# Patient Record
Sex: Female | Born: 1965 | Race: White | Hispanic: No | Marital: Married | Smoking: Never smoker
Health system: Southern US, Community
[De-identification: ages and names within clinical notes are randomized; demographics above are authoritative.]

## PROBLEM LIST (undated history)

## (undated) DIAGNOSIS — E079 Disorder of thyroid, unspecified: Secondary | ICD-10-CM

## (undated) DIAGNOSIS — R112 Nausea with vomiting, unspecified: Secondary | ICD-10-CM

## (undated) DIAGNOSIS — A6920 Lyme disease, unspecified: Secondary | ICD-10-CM

## (undated) DIAGNOSIS — T4145XA Adverse effect of unspecified anesthetic, initial encounter: Secondary | ICD-10-CM

## (undated) DIAGNOSIS — Z9889 Other specified postprocedural states: Secondary | ICD-10-CM

## (undated) DIAGNOSIS — T8859XA Other complications of anesthesia, initial encounter: Secondary | ICD-10-CM

## (undated) DIAGNOSIS — B2790 Infectious mononucleosis, unspecified without complication: Secondary | ICD-10-CM

## (undated) HISTORY — PX: APPENDECTOMY: SHX54

## (undated) HISTORY — PX: LIPOSUCTION: SHX10

---

## 1998-02-01 ENCOUNTER — Ambulatory Visit (HOSPITAL_COMMUNITY): Admission: RE | Admit: 1998-02-01 | Discharge: 1998-02-01 | Payer: Self-pay | Admitting: Specialist

## 1998-06-04 ENCOUNTER — Emergency Department (HOSPITAL_COMMUNITY): Admission: EM | Admit: 1998-06-04 | Discharge: 1998-06-04 | Payer: Self-pay | Admitting: Emergency Medicine

## 1998-07-19 ENCOUNTER — Other Ambulatory Visit: Admission: RE | Admit: 1998-07-19 | Discharge: 1998-07-19 | Payer: Self-pay | Admitting: Obstetrics and Gynecology

## 1999-07-16 ENCOUNTER — Other Ambulatory Visit: Admission: RE | Admit: 1999-07-16 | Discharge: 1999-07-16 | Payer: Self-pay | Admitting: Obstetrics and Gynecology

## 2000-07-27 ENCOUNTER — Other Ambulatory Visit: Admission: RE | Admit: 2000-07-27 | Discharge: 2000-07-27 | Payer: Self-pay | Admitting: Obstetrics and Gynecology

## 2000-08-03 ENCOUNTER — Encounter: Payer: Self-pay | Admitting: Obstetrics and Gynecology

## 2000-08-03 ENCOUNTER — Encounter: Admission: RE | Admit: 2000-08-03 | Discharge: 2000-08-03 | Payer: Self-pay | Admitting: Obstetrics and Gynecology

## 2001-02-02 ENCOUNTER — Encounter: Admission: RE | Admit: 2001-02-02 | Discharge: 2001-02-02 | Payer: Self-pay | Admitting: Obstetrics and Gynecology

## 2001-02-02 ENCOUNTER — Encounter: Payer: Self-pay | Admitting: Obstetrics and Gynecology

## 2001-12-29 ENCOUNTER — Other Ambulatory Visit: Admission: RE | Admit: 2001-12-29 | Discharge: 2001-12-29 | Payer: Self-pay | Admitting: Obstetrics and Gynecology

## 2003-06-05 ENCOUNTER — Other Ambulatory Visit: Admission: RE | Admit: 2003-06-05 | Discharge: 2003-06-05 | Payer: Self-pay | Admitting: Obstetrics and Gynecology

## 2004-06-03 ENCOUNTER — Other Ambulatory Visit: Admission: RE | Admit: 2004-06-03 | Discharge: 2004-06-03 | Payer: Self-pay | Admitting: Obstetrics and Gynecology

## 2004-10-22 ENCOUNTER — Ambulatory Visit (HOSPITAL_COMMUNITY): Admission: RE | Admit: 2004-10-22 | Discharge: 2004-10-22 | Payer: Self-pay | Admitting: Obstetrics and Gynecology

## 2004-12-20 ENCOUNTER — Inpatient Hospital Stay (HOSPITAL_COMMUNITY): Admission: RE | Admit: 2004-12-20 | Discharge: 2004-12-23 | Payer: Self-pay | Admitting: Obstetrics and Gynecology

## 2005-02-05 ENCOUNTER — Other Ambulatory Visit: Admission: RE | Admit: 2005-02-05 | Discharge: 2005-02-05 | Payer: Self-pay | Admitting: Obstetrics and Gynecology

## 2007-01-27 ENCOUNTER — Encounter: Admission: RE | Admit: 2007-01-27 | Discharge: 2007-01-27 | Payer: Self-pay | Admitting: Internal Medicine

## 2007-02-08 ENCOUNTER — Encounter: Admission: RE | Admit: 2007-02-08 | Discharge: 2007-02-08 | Payer: Self-pay | Admitting: Internal Medicine

## 2007-03-01 ENCOUNTER — Encounter: Admission: RE | Admit: 2007-03-01 | Discharge: 2007-03-01 | Payer: Self-pay | Admitting: Internal Medicine

## 2007-03-10 ENCOUNTER — Encounter: Admission: RE | Admit: 2007-03-10 | Discharge: 2007-05-05 | Payer: Self-pay | Admitting: Internal Medicine

## 2009-06-11 ENCOUNTER — Ambulatory Visit: Payer: Self-pay | Admitting: Internal Medicine

## 2009-11-12 ENCOUNTER — Ambulatory Visit: Payer: Self-pay | Admitting: Internal Medicine

## 2011-01-17 NOTE — H&P (Signed)
Nancy Castaneda, GILLETTE              ACCOUNT NO.:  [id number]   MEDICAL RECORD NO.:  [id number]          PATIENT TYPE:  INP   LOCATION:  NA                            FACILITY:  [facility]   PHYSICIAN:  Duke Salvia. Marcelle Overlie, M.D.DATE OF BIRTH:  1965/11/14   DATE OF ADMISSION:  DATE OF DISCHARGE:                                HISTORY & PHYSICAL   CHIEF COMPLAINT:  Repeat cesarean section at term.   HISTORY OF PRESENT ILLNESS:  A 45 year old, G2, P8, EDD December 28, 2004, who  presents for RCS after uncomplicated prenatal course.  She had early trisomy  21 screening at [facility] that was normal.  Blood type was O positive.  Rubella  titer was immune.  Her one hour GTT was 93.  GBS is negative.   PAST MEDICAL HISTORY:   ALLERGIES:  CODEINE.   OPERATIONS:  Cesarean section in 1998 for CPD after pushing for 4 hours.   REVIEW OF SYSTEMS:  Otherwise unremarkable.   FAMILY HISTORY:  Please see the enclosed hollister form.   PHYSICAL EXAMINATION:  VITAL SIGNS:  Temperature 98.2, blood pressure  120/62.  HEENT:  Unremarkable.  NECK:  Supple without masses.  LUNGS:  Clear.  CARDIOVASCULAR:  Regular rate and rhythm without murmurs, rubs, or gallops  noted.  BREASTS:  Not examined.  ABDOMEN:  Term fundal height.  Fetal heart rate 140.  PELVIC:  Cervix was closed.  EXTREMITIES:  Neurologic exam unremarkable.   IMPRESSION:  Term intrauterine pregnancy.  Previous cesarean section.  Declines vaginal birth after cesarean section.   PLAN:  Repeat cesarean section.  This procedure, including risks of  bleeding, transfusion, adjacent organ injury, expected recovery time, along  with other risks related to phlebitis and wound infection were discussed,  all of which he understands and accepts.    RMH/MEDQ  D:  12/19/2004  T:  12/19/2004  Job:  [id number]

## 2011-01-17 NOTE — Op Note (Signed)
Nancy Castaneda, KEEL              ACCOUNT NO.:  [id number]   MEDICAL RECORD NO.:  [id number]          PATIENT TYPE:  INP   LOCATION:  9199                          FACILITY:  [facility]   PHYSICIAN:  Duke Salvia. Marcelle Overlie, M.D.DATE OF BIRTH:  [birth year]   DATE OF PROCEDURE:  12/20/2004  DATE OF DISCHARGE:                                 OPERATIVE REPORT   PREOPERATIVE DIAGNOSES:  1.  Term intrauterine pregnancy.  2.  Prior cesarean section, declines vaginal birth after cesarean delivery.   POSTOPERATIVE DIAGNOSES:  1.  Term intrauterine pregnancy.  2.  Prior cesarean section, declines vaginal birth after cesarean delivery.   PROCEDURE:  Repeat low transverse cesarean section.   SURGEON:  Duke Salvia. Marcelle Overlie, M.D.   ANESTHESIA:  Spinal.   COMPLICATIONS:  None.   DRAINS:  Foley catheter.   ESTIMATED BLOOD LOSS:  800.   PROCEDURE AND FINDINGS:  The patient sent to the operating room.  After an  adequate level of spinal anesthetic was obtained with the patient supine,  the abdomen prepped and draped in the usual manner for sterile abdominal  procedures.  A Foley catheter positioned draining clear urine.  Transverse  incision made through the old scar, which was well-healed, carried down to  the fascia, which was incised and extended transversely.  The rectus muscles  were divided in the midline, the peritoneum entered superiorly without  incident and extended in a vertical fashion.  The vesicouterine serosa was  then incised and the bladder was bluntly and sharply dissected off of the  lower uterine segment and the bladder blade was positioned.  A transverse  incision was then made, extended with bandage scissors, clear fluid noted.  The patient then easily delivered of a clear female, Apgars 9 and 9.  The  infant was suctioned, the cord clamped, and passed to the pediatric team for  further care.  The placenta was removed manually intact, uterus  exteriorized, cavity wiped clean with  a laparotomy pack.  The uterine  incision closed with a first layer of 0 chromic in a locked fashion,  followed by an imbricating layer of 0 chromic.  This was hemostatic.  Tubes  and ovaries were inspected and noted to be normal.  The bladder flap area  was intact and hemostatic.  Prior to closure, sponge, needle and instrument  counts were reported as correct x2.  Peritoneum closed with a running 3-0  Vicryl suture.  Vicryl 3-0 interrupted sutures used to reapproximate the  rectus muscles in the midline, a 0 PDS suture used from laterally to midline  on either side to close the fascia.  The subcutaneous tissue was undermined  to  release the tension.  This was made hemostatic with the Bovie.  This was  then closed with staples and Steri-Strips along with a pressure dressing.  She received Ancef 1 g IV after the cord was clamped along with Pitocin IV.  Clear urine noted at the end of the case.  Mother and baby doing well at  that point.      RMH/MEDQ  D:  12/20/2004  T:  12/20/2004  Job:  [id number]

## 2011-01-17 NOTE — Discharge Summary (Signed)
NAMEINOLA, Nancy Castaneda              ACCOUNT NO.:  [id number]   MEDICAL RECORD NO.:  [id number]          PATIENT TYPE:  INP   LOCATION:  9141                          [facility]ACILITY:  [facility]   PHYSICIAN:  Duke Salvia. Marcelle Overlie, M.D.DATE OF BIRTH:  05-Dec-1965   DATE OF ADMISSION:  12/20/2004  DATE OF DISCHARGE:  12/23/2004                                 DISCHARGE SUMMARY   ADMITTING DIAGNOSES:  1.  Intrauterine pregnancy at term.  2.  Previous cesarean section, declines vaginal birth after cesarean.   DISCHARGE DIAGNOSES:  1.  Status post low transverse cesarean section.  2.  Viable female infant.   PROCEDURE:  Repeat low transverse cesarean section.   REASON FOR ADMISSION:  Please see dictated H&P.   HOSPITAL COURSE:  The patient was a 45 year old gravida 2 para 1 that was  admitted to [facility] for scheduled ces[facility] section. The  patient had had a previous cesarean section, desired repeat. On the morning  of admission, the patient was taken to the operating room where spinal  anesthesia was administered without difficulty. A low transverse incision  was with the delivery of viable female infant weighing 7 pounds 13 ounces  with Apgars of 9 at one minute and 9 at five minutes. The patient tolerated  the procedure well and was taken to the recovery room in stable condition.  On postoperative day #1 the patient was without complaint. Vital signs were  stable. Abdomen soft with good return of bowel function. Fundus was firm and  nontender. Abdominal dressing was noted to be clean, dry and intact.  Laboratory findings revealed hemoglobin of 10.3. On postoperative day #2 the  patient was without complaint. Vital signs remained stable. She was  afebrile. Abdominal dressing had been removed revealing incision that is  clean, dry and intact. The patient was ambulating well and tolerating a  regular diet without complaints of nausea and vomiting. On postoperative day  #3 the  patient complained of some nausea without vomiting. Vital signs were  stable. She was afebrile. Abdomen was soft. Fundus was firm and nontender.  Incision was clean, dry and intact. Staples were removed. Discharge  instructions reviewed and the patient was discharged home.   CONDITION ON DISCHARGE:  Good.   DIET:  Regular as tolerated.   ACTIVITY:  No heavy lifting, no driving x2 weeks, no vaginal entry.   FOLLOW-UP:  The patient is to follow up in the office in 1-2 weeks for  incision check. She is to call for temperature greater than 100 degrees,  persistent nausea and vomiting, heavy vaginal bleeding, and/or redness or  drainage from the incisional site.   DISCHARGE MEDICATIONS:  1.  Darvocet -N 100 #30 one p.o. q.4-6h. p.r.n.  2.  Motrin 600 mg q.6h.  3.  Phenergan 25 mg #20 one p.o. q.6-8h. p.r.n. nausea.  4.  Prenatal vitamins one p.o. daily.  5.  Colace one p.o. daily p.r.n.      CC/MEDQ  D:  01/17/2005  T:  01/17/2005  Job:  [id number]

## 2011-12-17 ENCOUNTER — Encounter (HOSPITAL_COMMUNITY): Payer: Self-pay | Admitting: Emergency Medicine

## 2011-12-17 ENCOUNTER — Emergency Department (INDEPENDENT_AMBULATORY_CARE_PROVIDER_SITE_OTHER): Payer: BC Managed Care – PPO

## 2011-12-17 ENCOUNTER — Emergency Department (HOSPITAL_COMMUNITY)
Admission: EM | Admit: 2011-12-17 | Discharge: 2011-12-17 | Disposition: A | Discharge status: Home / Self Care | Payer: BC Managed Care – PPO | Source: Home / Self Care | Attending: Emergency Medicine | Admitting: Emergency Medicine

## 2011-12-17 DIAGNOSIS — S62009A Unspecified fracture of navicular [scaphoid] bone of unspecified wrist, initial encounter for closed fracture: Secondary | ICD-10-CM

## 2011-12-17 MED ORDER — HYDROCODONE-ACETAMINOPHEN 5-500 MG PO TABS: 1.0000 | ORAL_TABLET | Freq: Four times a day (QID) | ORAL | Status: AC | PRN

## 2011-12-17 NOTE — ED Provider Notes (Addendum)
History     CSN: [id number]  Arrival date & time 12/17/11  0844   First MD Initiated Contact with Patient 12/17/11 [phone]      Chief Complaint  Patient presents with  . Wrist Pain    (Consider location/radiation/quality/duration/timing/severity/associated sxs/prior treatment) HPI Comments: Patient sustained a fall yesterday she was walking with a friend tripped over an irregular sidewalk, landed on a outstretched left hand. Since yesterday has been expressing pain and have noticed some mild soft tissue swelling of dorsum aspect of her left hand.. Discomfort is exacerbated with minimal movement. Patient denies any tingling, numbness, or weakness. No associated soft tissue injuries are associated with the fall  Patient is a 46 y.o. female presenting with wrist pain. The history is provided by the patient.  Wrist Pain This is a new problem. The current episode started yesterday. The problem occurs constantly. The problem has not changed since onset.The symptoms are aggravated by stress and twisting. The symptoms are relieved by rest. She has tried nothing for the symptoms. The treatment provided no relief.    History reviewed. No pertinent past medical history.  History reviewed. No pertinent past surgical history.  History reviewed. No pertinent family history.  History  Substance Use Topics  . Smoking status: Never Smoker   . Smokeless tobacco: Not on file  . Alcohol Use: No    OB History    Grav Para Term Preterm Abortions TAB SAB Ect Mult Living                  Review of Systems  Constitutional: Negative for fever and diaphoresis.  Musculoskeletal: Positive for joint swelling.  Skin: Negative for color change, rash and wound.  Neurological: Negative for weakness and numbness.    Allergies  Codeine and Lidocaine  Home Medications   Current Outpatient Rx  Name Route Sig Dispense Refill  . VITAMIN C 100 MG PO TABS Oral Take 100 mg by mouth daily.    Marland Kitchen BIOTIN 5 MG  PO TABS Oral Take by mouth.    Marland Kitchen CALCIUM CARBONATE-VITAMIN D 500-200 MG-UNIT PO TABS Oral Take 1 tablet by mouth daily.    Marland Kitchen DHEA 10 MG PO CAPS Oral Take by mouth.    . FOLIC ACID 1 MG PO TABS Oral Take 1 mg by mouth daily.    Marland Kitchen GREEN TEA EXTRACT 90 % LIQD Does not apply by Does not apply route.    . INOSITOL 650 MG PO TABS Oral Take by mouth.    Marland Kitchen POLYSACCHARIDE IRON COMPLEX 150 MG PO CAPS Oral Take 150 mg by mouth 2 (two) times daily.    Marland Kitchen MAGNESIUM GLUCONATE 500 MG PO TABS Oral Take 500 mg by mouth 2 (two) times daily.    . METHYLCOBALAMIN 1 MG PO CHEW Oral Chew by mouth.    . MULTIVITAMINS PO CAPS Oral Take 1 capsule by mouth daily.    . TRANQUIL-EZE PO Oral Take by mouth.    Marland Kitchen MARINE LIPID CONCENTRATE 240-360-5 MG-MG-UNIT PO CAPS Oral Take by mouth.    Marland Kitchen PREGNENOLONE POWD Does not apply by Does not apply route.    Marland Kitchen PROGESTERONE 100 MG VA INST Vaginal Place 100 mg vaginally 2 (two) times daily.    . THYROID 30 MG PO TABS Oral Take 30 mg by mouth daily.    Marland Kitchen VITAMIN B-12 500 MCG PO TABS Oral Take 1,000 mcg by mouth daily.    Marland Kitchen VITAMIN D (ERGOCALCIFEROL) 50000 UNITS PO CAPS Oral Take  50,000 Units by mouth.    Marland Kitchen HYDROCODONE-ACETAMINOPHEN 5-500 MG PO TABS Oral Take 1-2 tablets by mouth every 6 (six) hours as needed for pain. 15 tablet 0    BP 124/82  Pulse 68  Temp(Src) 98.5 F (36.9 C) (Oral)  Resp 14  SpO2 100%  LMP 12/07/2011  Physical Exam  Vitals reviewed. Constitutional: She appears well-developed and well-nourished.  HENT:  Head: Normocephalic.  Musculoskeletal: She exhibits tenderness.       Arms: Neurological: She is alert.  Skin: Skin is warm. No rash noted. No erythema.    ED Course  Procedures (including critical care time)  Labs Reviewed - No data to display No results found.   1. Scaphoid fracture of wrist       MDM          Jimmie Molly, MD 12/17/11 [id number]  Jimmie Molly, MD 12/27/11 1455

## 2011-12-17 NOTE — ED Notes (Signed)
Pt was walking in her neighborhood last night and fell on her left wrist. She has put ice on it all night which helped numb it but it was still very painful.

## 2011-12-17 NOTE — Discharge Instructions (Signed)
Follow-up with the hand orthopedic Dr.,As discussed. We discussed the necessary followup and current treatment needed immobilization

## 2011-12-27 ENCOUNTER — Encounter (HOSPITAL_COMMUNITY): Payer: Self-pay | Admitting: Emergency Medicine

## 2018-10-28 ENCOUNTER — Emergency Department (HOSPITAL_COMMUNITY)
Admission: EM | Admit: 2018-10-28 | Discharge: 2018-10-28 | Disposition: A | Discharge status: Home / Self Care | Payer: BLUE CROSS/BLUE SHIELD | Attending: Emergency Medicine | Admitting: Emergency Medicine

## 2018-10-28 ENCOUNTER — Other Ambulatory Visit: Payer: Self-pay

## 2018-10-28 ENCOUNTER — Emergency Department (HOSPITAL_COMMUNITY): Payer: BLUE CROSS/BLUE SHIELD

## 2018-10-28 ENCOUNTER — Encounter (HOSPITAL_COMMUNITY): Payer: Self-pay | Admitting: Emergency Medicine

## 2018-10-28 DIAGNOSIS — R5383 Other fatigue: Secondary | ICD-10-CM | POA: Insufficient documentation

## 2018-10-28 DIAGNOSIS — R197 Diarrhea, unspecified: Secondary | ICD-10-CM | POA: Insufficient documentation

## 2018-10-28 DIAGNOSIS — R748 Abnormal levels of other serum enzymes: Secondary | ICD-10-CM | POA: Insufficient documentation

## 2018-10-28 DIAGNOSIS — Z79899 Other long term (current) drug therapy: Secondary | ICD-10-CM | POA: Diagnosis not present

## 2018-10-28 DIAGNOSIS — E079 Disorder of thyroid, unspecified: Secondary | ICD-10-CM | POA: Insufficient documentation

## 2018-10-28 DIAGNOSIS — R109 Unspecified abdominal pain: Secondary | ICD-10-CM | POA: Diagnosis not present

## 2018-10-28 DIAGNOSIS — R112 Nausea with vomiting, unspecified: Secondary | ICD-10-CM | POA: Insufficient documentation

## 2018-10-28 DIAGNOSIS — R1013 Epigastric pain: Secondary | ICD-10-CM | POA: Diagnosis present

## 2018-10-28 DIAGNOSIS — R1010 Upper abdominal pain, unspecified: Secondary | ICD-10-CM | POA: Insufficient documentation

## 2018-10-28 HISTORY — DX: Infectious mononucleosis, unspecified without complication: B27.90

## 2018-10-28 HISTORY — DX: Disorder of thyroid, unspecified: E07.9

## 2018-10-28 HISTORY — DX: Lyme disease, unspecified: A69.20

## 2018-10-28 LAB — COMPREHENSIVE METABOLIC PANEL
ALK PHOS: 87 U/L (ref 38–126)
ALT: 24 U/L (ref 0–44)
AST: 22 U/L (ref 15–41)
Albumin: 4 g/dL (ref 3.5–5.0)
Anion gap: 9 (ref 5–15)
BUN: 16 mg/dL (ref 6–20)
CHLORIDE: 106 mmol/L (ref 98–111)
CO2: 24 mmol/L (ref 22–32)
CREATININE: 0.63 mg/dL (ref 0.44–1.00)
Calcium: 9.4 mg/dL (ref 8.9–10.3)
GFR calc Af Amer: >60 mL/min (ref >60)
GFR calc non Af Amer: >60 mL/min (ref >60)
Glucose, Bld: 99 mg/dL (ref 70–99)
Potassium: 3.7 mmol/L (ref 3.5–5.1)
Sodium: 139 mmol/L (ref 135–145)
Total Bilirubin: 0.7 mg/dL (ref 0.3–1.2)
Total Protein: 7.4 g/dL (ref 6.5–8.1)

## 2018-10-28 LAB — URINALYSIS, ROUTINE W REFLEX MICROSCOPIC
BILIRUBIN URINE: NEGATIVE
Glucose, UA: NEGATIVE mg/dL
HGB URINE DIPSTICK: NEGATIVE
KETONES UR: 20 mg/dL — AB
Leukocytes,Ua: NEGATIVE
NITRITE: NEGATIVE
PH: 5 (ref 5.0–8.0)
Protein, ur: NEGATIVE mg/dL
SPECIFIC GRAVITY, URINE: 1.017 (ref 1.005–1.030)

## 2018-10-28 LAB — CBC
HCT: 49.1 % — ABNORMAL HIGH (ref 36.0–46.0)
HEMOGLOBIN: 16.1 g/dL — AB (ref 12.0–15.0)
MCH: 30.6 pg (ref 26.0–34.0)
MCHC: 32.8 g/dL (ref 30.0–36.0)
MCV: 93.2 fL (ref 80.0–100.0)
Platelets: 261 10*3/uL (ref 150–400)
RBC: 5.27 MIL/uL — ABNORMAL HIGH (ref 3.87–5.11)
RDW: 11.9 % (ref 11.5–15.5)
WBC: 15.5 10*3/uL — AB (ref 4.0–10.5)
nRBC: 0 % (ref 0.0–0.2)

## 2018-10-28 LAB — LIPASE, BLOOD: LIPASE: 713 U/L — AB (ref 11–51)

## 2018-10-28 LAB — I-STAT BETA HCG BLOOD, ED (MC, WL, AP ONLY)

## 2018-10-28 MED ORDER — ONDANSETRON HCL 4 MG PO TABS: 4.0000 mg | ORAL_TABLET | Freq: Three times a day (TID) | ORAL | 0 refills | Status: DC | PRN

## 2018-10-28 MED ORDER — IOPAMIDOL (ISOVUE-300) INJECTION 61%
INTRAVENOUS | Status: AC
  Filled 2018-10-28: qty 100

## 2018-10-28 MED ORDER — IOPAMIDOL (ISOVUE-300) INJECTION 61%: 100.0000 mL | Freq: Once | INTRAVENOUS | Status: DC | PRN

## 2018-10-28 MED ORDER — SODIUM CHLORIDE (PF) 0.9 % IJ SOLN
INTRAMUSCULAR | Status: AC
  Filled 2018-10-28: qty 50

## 2018-10-28 NOTE — ED Notes (Signed)
Pt inquired about wait for [place] Pt informed that [place] technician will be coming from [facility] because we do not have an [place] technician at night Pt informed that the order has been placed Pt thanked for her patience

## 2018-10-28 NOTE — ED Triage Notes (Signed)
Per [facility] pt from home c/o abd with n/v/d since lunch time today. Pt had pains after eating for the past 5 days. Pt taking lots of supplements.  18G IV in right AC.

## 2018-10-28 NOTE — ED Provider Notes (Signed)
[facility]-EMERGENCY DEPT Provider Note   CSN: [id number] Arrival date & time: 10/28/18  1623    History   Chief Complaint No chief complaint on file.   HPI Nancy Castaneda is a 53 y.o. female.     The history is provided by the patient and medical records. No language interpreter was used.     53 year old female brought here via EMS from home for evaluation of abd pain.  Patient report for the past 5 days she has had intermittent epigastric abdominal pain.  She described the pain as a cramping sensation, usually lasting approximately 30 minutes and usually brought on after a meal.  Today, after eating, her pain became more severe lasting for several hours, she felt very nauseous, vomited once and had some loose stools.  She states she broke out in a sweat and feeling very fatigued.  She has never had this kind of pain before and EMS was contacted.  At this time her pain has mostly subsided.  No report of any fever or chills no chest pain shortness of breath productive cough, back pain, dysuria, hematuria, or rash.  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  Patient denies alcohol or drug use.  No prior history of gallbladder problem.  No history of diabetes.  She does takes quite a few enzymes and was unsure if it could have caused her discomfort.  These enzymes are prescribed by her PCP.      Past Medical History:  Diagnosis Date  . Malachi Carl infection   . Lyme disease   . Thyroid disease     There are no active problems to display for this patient.   Past Surgical History:  Procedure Laterality Date  . CESAREAN SECTION    . LIPOSUCTION       OB History   No obstetric history on file.      Home Medications    Prior to Admission medications     Medication Sig Start Date End Date Taking? Authorizing Provider  Ascorbic Acid (VITAMIN C) 100 MG tablet Take 100 mg by mouth daily.    [provider]  Biotin 5 MG TABS Take by mouth.    [provider]  calcium-vitamin D (OSCAL WITH D) 500-200 MG-UNIT per tablet Take 1 tablet by mouth daily.    [provider]  DHEA 10 MG CAPS Take by mouth.    [provider]  folic acid (FOLVITE) 1 MG tablet Take 1 mg by mouth daily.    [provider]  Janett Billow, Camillia sinensis, (GREEN TEA EXTRACT) 90 % LIQD by Does not apply route.    [provider]  Inositol 650 MG TABS Take by mouth.    [provider]  iron polysaccharides (NIFEREX) 150 MG capsule Take 150 mg by mouth 2 (two) times daily.    [provider]  magnesium gluconate (MAGONATE) 500 MG tablet Take 500 mg by mouth 2 (two) times daily.    [provider]  Methylcobalamin 1 MG CHEW Chew by mouth.    [provider]  Multiple Vitamin (MULTIVITAMIN) capsule Take 1 capsule by mouth daily.    [provider]  Nutritional Supplements (TRANQUIL-EZE PO) Take by mouth.    [provider]  Omega-3 Fatty Acids (MARINE LIPID  CONCENTRATE) 240-360-5 MG-MG-UNIT CAPS Take by mouth.    [provider]  Pregnenolone POWD by Does not apply route.    [provider]  progesterone (ENDOMETRIN) 100 MG vaginal insert Place 100 mg vaginally 2 (two) times daily.    [provider]  thyroid (ARMOUR) 30 MG tablet Take 30 mg by mouth daily.    [provider]  vitamin B-12 (CYANOCOBALAMIN) 500 MCG tablet Take 1,000 mcg by mouth daily.    [provider]  Vitamin D, Ergocalciferol, (DRISDOL) 50000 UNITS CAPS Take 50,000 Units by mouth.    [provider]    Family History No family history on file.  Social History Social History   Tobacco Use  . Smoking status: Never Smoker  . Smokeless tobacco: Never  Used  Substance Use Topics  . Alcohol use: No  . Drug use: No     Allergies   Codeine and Lidocaine   Review of Systems Review of Systems  All other systems reviewed and are negative.    Physical Exam Updated Vital Signs BP (!) 148/48 (BP Location: Left Arm)   Pulse 72   Temp 98.1 F (36.7 C) (Oral)   Resp 20   LMP 12/07/2011   SpO2 100%   Physical Exam Vitals signs and nursing note reviewed.  Constitutional:      General: She is not in acute distress.    Appearance: She is well-developed.  HENT:     Head: Atraumatic.  Eyes:     Conjunctiva/sclera: Conjunctivae normal.  Neck:     Musculoskeletal: Neck supple.  Cardiovascular:     Rate and Rhythm: Normal rate and regular rhythm.  Pulmonary:     Effort: Pulmonary effort is normal.     Breath sounds: Normal breath sounds.  Abdominal:     General: Abdomen is flat.     Tenderness: There is abdominal tenderness (Mild epigastric and paramedical tenderness without guarding or rebound tenderness.  Negative Murphy sign, no pain at McBurney's point.  Abdomen is soft, bowel sounds present.).  Skin:    Findings: No rash.  Neurological:     Mental Status: She is alert.      ED Treatments / Results  Labs (all labs ordered are listed, but only abnormal results are displayed) Labs Reviewed  LIPASE, BLOOD - Abnormal; Notable for the following components:      Result Value   Lipase 713 (*)    All other components within normal limits  CBC - Abnormal; Notable for the following components:   WBC 15.5 (*)    RBC 5.27 (*)    Hemoglobin 16.1 (*)    HCT 49.1 (*)    All other components within normal limits  URINALYSIS, ROUTINE W REFLEX MICROSCOPIC - Abnormal; Notable for the following components:   Ketones, ur 20 (*)    All other components within normal limits  COMPREHENSIVE METABOLIC PANEL  I-STAT BETA HCG BLOOD, ED ([facility], [facility], [facility] ONLY)    EKG None  Radiology Ct Abdomen Pelvis Wo Contrast  Result Date:  10/28/2018 CLINICAL DATA:  Periumbilical pain EXAM: CT ABDOMEN AND PELVIS WITHOUT CONTRAST TECHNIQUE: Multidetector CT imaging of the abdomen and pelvis was performed following the standard protocol without IV contrast. COMPARISON:  Ultrasound 01/27/2007 FINDINGS: Lower chest: Lung bases are clear. No effusions. Heart is normal size. Hepatobiliary: No focal hepatic abnormality. Gallbladder unremarkable. Pancreas: No focal abnormality or ductal dilatation. Spleen: No focal abnormality.  Normal size. Adrenals/Urinary Tract: No adrenal abnormality. No focal  renal abnormality. No stones or hydronephrosis. Urinary bladder is unremarkable. Stomach/Bowel: There are appendicoliths within the mid appendix. Appendix measures 7 mm in diameter. No surrounding inflammation. Stomach, large and small bowel grossly unremarkable. Vascular/Lymphatic: No evidence of aneurysm or adenopathy. Reproductive: Uterus and adnexa unremarkable.  No mass. Other: Small amount of free fluid within the cul-de-sac. Musculoskeletal: No acute bony abnormality. IMPRESSION: Appendicoliths noted within the mid appendix which is upper limits/borderline in diameter at 7 mm. No visible surrounding inflammatory change. Recommend clinical correlation to completely exclude early acute appendicitis. Small amount of free fluid in the cul-de-sac. Electronically Signed   By: Charlett Nose M.D.   On: 10/28/2018 18:57   US Abdomen Limited  Result Date: 10/28/2018 CLINICAL DATA:  Upper abdominal pain.  Elevated lipase. EXAM: ULTRASOUND ABDOMEN LIMITED RIGHT UPPER QUADRANT COMPARISON:  CT, 10/28/2018. FINDINGS: Gallbladder: No gallstones or wall thickening visualized. No sonographic Murphy sign noted by sonographer. Common bile duct: Diameter: 3 mm Liver: No focal lesion identified. Within normal limits in parenchymal echogenicity. Portal vein is patent on color Doppler imaging with normal direction of blood flow towards the liver. IMPRESSION: Normal right upper  quadrant ultrasound. Electronically Signed   By: Amie Portland M.D.   On: 10/28/2018 21:14    Procedures Procedures (including critical care time)  Medications Ordered in ED Medications  sodium chloride (PF) 0.9 % injection (has no administration in time range)     Initial Impression / Assessment and Plan / ED Course  I have reviewed the triage vital signs and the nursing notes.  Pertinent labs & imaging results that were available during my care of the patient were reviewed by me and considered in my medical decision making (see chart for details).        BP 130/63   Pulse 80   Temp 98.1 F (36.7 C) (Oral)   Resp 18   LMP 12/07/2011   SpO2 100%    Final Clinical Impressions(s) / ED Diagnoses   Final diagnoses:  None    ED Discharge Orders    None     6:10 PM Patient here with postprandial pain ongoing for nearly a week pain became much more intensified today.  She is currently without any significant abdominal discomfort despite not receiving any kind of treatment. No tenderness to RUQ to suggest gallbladder etiology.  Elevated WBC of 15.5.  Will obtain abdominal pelvis CT scan for further evaluation.  6:40 PM Pt voice concern about contrast media given her hx of MTRHFR.  Therefore, will perform abd/pelvis CT w/o CM.   9:30 PM Urine without signs of urinary tract infection, pregnancy test is negative, lipase elevated 713 electrolyte panels are reassuring, elevated white count of 15, initial abdominal pelvis CT scan demonstrated an appendicolith within the mid appendix which is upper limit in diameter 7 mm but no visible surrounding inflammatory changes.  On exam and reexamination, no tenderness to right lower quadrant.  Limited abdominal ultrasound was performed without any acute finding.  Patient does takes many digestive enzymes, one of which is lipase.  I suspect elevated lipase may be secondary to enzyme.  Urged patient to discontinue this medication, follow-up  closely with her PCP as well with GI specialist for further care.  Patient also made aware of potential early appendicitis and would benefit from serial abdominal exam if his symptoms worsen.  Otherwise, she is currently pain-free and stable for discharge.  Care discussed with Dr. Rhunette Croft.   Fayrene Helper, PA-C 10/28/18 2136  Derwood KaplanNanavati, Ankit, MD 10/28/18 [phone]

## 2018-10-28 NOTE — ED Notes (Signed)
Plan of care that was discussed with Greta Doom, PA to discharge has changed. PA at bedside at this time to discuss change in plan of care.

## 2018-10-28 NOTE — Discharge Instructions (Addendum)
You have been evaluated for your abdominal pain.  Your lipase enzyme is elevated at 713 however no evidence of pancreatitis on your CT scan.  This abnormal lab may be due to the digestive enzymes that you are currently taking.  Please discuss this with your doctor promptly.  Your CT scan did show that your appendix is mildly enlarge at the upper limit range.  If your abdominal pain worsen, please return to the ER for recheck.  Follow up with GI specialist for further care. Take Zofran as needed for nausea.

## 2018-11-24 ENCOUNTER — Encounter (HOSPITAL_COMMUNITY): Payer: Self-pay | Admitting: *Deleted

## 2018-11-24 ENCOUNTER — Emergency Department (HOSPITAL_COMMUNITY): Payer: BLUE CROSS/BLUE SHIELD

## 2018-11-24 ENCOUNTER — Other Ambulatory Visit: Payer: Self-pay

## 2018-11-24 ENCOUNTER — Emergency Department (HOSPITAL_COMMUNITY): Payer: BLUE CROSS/BLUE SHIELD | Admitting: Anesthesiology

## 2018-11-24 ENCOUNTER — Observation Stay (HOSPITAL_COMMUNITY)
Admission: EM | Admit: 2018-11-24 | Discharge: 2018-11-25 | Disposition: A | Discharge status: Home / Self Care | Payer: BLUE CROSS/BLUE SHIELD | Attending: Surgery | Admitting: Surgery

## 2018-11-24 ENCOUNTER — Ambulatory Visit (HOSPITAL_COMMUNITY)
Admission: EM | Admit: 2018-11-24 | Discharge: 2018-11-24 | Disposition: A | Discharge status: Home / Self Care | Payer: BLUE CROSS/BLUE SHIELD | Source: Home / Self Care

## 2018-11-24 ENCOUNTER — Encounter (HOSPITAL_COMMUNITY)
Admission: EM | Disposition: A | Discharge status: Home / Self Care | Payer: Self-pay | Source: Home / Self Care | Attending: Emergency Medicine

## 2018-11-24 DIAGNOSIS — K3532 Acute appendicitis with perforation and localized peritonitis, without abscess: Principal | ICD-10-CM | POA: Insufficient documentation

## 2018-11-24 DIAGNOSIS — Z79890 Hormone replacement therapy (postmenopausal): Secondary | ICD-10-CM | POA: Insufficient documentation

## 2018-11-24 DIAGNOSIS — K3580 Unspecified acute appendicitis: Secondary | ICD-10-CM | POA: Diagnosis present

## 2018-11-24 DIAGNOSIS — Z885 Allergy status to narcotic agent status: Secondary | ICD-10-CM | POA: Diagnosis not present

## 2018-11-24 DIAGNOSIS — R109 Unspecified abdominal pain: Secondary | ICD-10-CM | POA: Diagnosis present

## 2018-11-24 HISTORY — DX: Nausea with vomiting, unspecified: R11.2

## 2018-11-24 HISTORY — DX: Adverse effect of unspecified anesthetic, initial encounter: T41.45XA

## 2018-11-24 HISTORY — DX: Other specified postprocedural states: Z98.890

## 2018-11-24 HISTORY — DX: Other complications of anesthesia, initial encounter: T88.59XA

## 2018-11-24 HISTORY — PX: LAPAROSCOPIC APPENDECTOMY: SHX408

## 2018-11-24 LAB — URINALYSIS, ROUTINE W REFLEX MICROSCOPIC
BILIRUBIN URINE: NEGATIVE
Glucose, UA: NEGATIVE mg/dL
Hgb urine dipstick: NEGATIVE
Ketones, ur: 80 mg/dL — AB
Leukocytes,Ua: NEGATIVE
Nitrite: NEGATIVE
PH: 5 (ref 5.0–8.0)
Protein, ur: NEGATIVE mg/dL
Specific Gravity, Urine: 1.017 (ref 1.005–1.030)

## 2018-11-24 LAB — COMPREHENSIVE METABOLIC PANEL
ALBUMIN: 3.8 g/dL (ref 3.5–5.0)
ALT: 17 U/L (ref 0–44)
AST: 15 U/L (ref 15–41)
Alkaline Phosphatase: 78 U/L (ref 38–126)
Anion gap: 11 (ref 5–15)
BUN: 9 mg/dL (ref 6–20)
CO2: 23 mmol/L (ref 22–32)
Calcium: 9.5 mg/dL (ref 8.9–10.3)
Chloride: 103 mmol/L (ref 98–111)
Creatinine, Ser: 0.72 mg/dL (ref 0.44–1.00)
GFR calc Af Amer: >60 mL/min (ref >60)
GFR calc non Af Amer: >60 mL/min (ref >60)
GLUCOSE: 102 mg/dL — AB (ref 70–99)
Potassium: 3.5 mmol/L (ref 3.5–5.1)
SODIUM: 137 mmol/L (ref 135–145)
Total Bilirubin: 1.3 mg/dL — ABNORMAL HIGH (ref 0.3–1.2)
Total Protein: 7 g/dL (ref 6.5–8.1)

## 2018-11-24 LAB — CBC WITH DIFFERENTIAL/PLATELET
Abs Immature Granulocytes: 0.09 10*3/uL — ABNORMAL HIGH (ref 0.00–0.07)
Basophils Absolute: 0 10*3/uL (ref 0.0–0.1)
Basophils Relative: 0 %
Eosinophils Absolute: 0.1 10*3/uL (ref 0.0–0.5)
Eosinophils Relative: 0 %
HCT: 44.8 % (ref 36.0–46.0)
HEMOGLOBIN: 14.6 g/dL (ref 12.0–15.0)
Immature Granulocytes: 1 %
Lymphocytes Relative: 11 %
Lymphs Abs: 1.7 10*3/uL (ref 0.7–4.0)
MCH: 29.9 pg (ref 26.0–34.0)
MCHC: 32.6 g/dL (ref 30.0–36.0)
MCV: 91.6 fL (ref 80.0–100.0)
Monocytes Absolute: 1.1 10*3/uL — ABNORMAL HIGH (ref 0.1–1.0)
Monocytes Relative: 7 %
Neutro Abs: 12.4 10*3/uL — ABNORMAL HIGH (ref 1.7–7.7)
Neutrophils Relative %: 81 %
Platelets: 209 10*3/uL (ref 150–400)
RBC: 4.89 MIL/uL (ref 3.87–5.11)
RDW: 11.9 % (ref 11.5–15.5)
WBC: 15.4 10*3/uL — ABNORMAL HIGH (ref 4.0–10.5)
nRBC: 0 % (ref 0.0–0.2)

## 2018-11-24 LAB — LIPASE, BLOOD: Lipase: 21 U/L (ref 11–51)

## 2018-11-24 SURGERY — APPENDECTOMY, LAPAROSCOPIC
Anesthesia: General | Site: Abdomen

## 2018-11-24 MED ORDER — SODIUM CHLORIDE 0.9 % IV SOLN
2.0000 g | Freq: Once | INTRAVENOUS | Status: AC
  Administered 2018-11-24: 2 g via INTRAVENOUS
  Filled 2018-11-24: qty 20

## 2018-11-24 MED ORDER — MIDAZOLAM HCL 2 MG/2ML IJ SOLN
INTRAMUSCULAR | Status: AC
  Filled 2018-11-24: qty 2

## 2018-11-24 MED ORDER — DIPHENHYDRAMINE HCL 50 MG/ML IJ SOLN
INTRAMUSCULAR | Status: DC | PRN
  Administered 2018-11-24: 25 mg via INTRAVENOUS

## 2018-11-24 MED ORDER — BUPIVACAINE-EPINEPHRINE (PF) 0.25% -1:200000 IJ SOLN
INTRAMUSCULAR | Status: AC
  Filled 2018-11-24: qty 30

## 2018-11-24 MED ORDER — SUCCINYLCHOLINE CHLORIDE 20 MG/ML IJ SOLN
INTRAMUSCULAR | Status: DC | PRN
  Administered 2018-11-24: 100 mg via INTRAVENOUS

## 2018-11-24 MED ORDER — PROPOFOL 10 MG/ML IV BOLUS
INTRAVENOUS | Status: DC | PRN
  Administered 2018-11-24: 200 mg via INTRAVENOUS

## 2018-11-24 MED ORDER — PROPOFOL 10 MG/ML IV BOLUS
INTRAVENOUS | Status: AC
  Filled 2018-11-24: qty 20

## 2018-11-24 MED ORDER — SUCCINYLCHOLINE CHLORIDE 200 MG/10ML IV SOSY
PREFILLED_SYRINGE | INTRAVENOUS | Status: AC
  Filled 2018-11-24: qty 10

## 2018-11-24 MED ORDER — ROCURONIUM BROMIDE 50 MG/5ML IV SOSY
PREFILLED_SYRINGE | INTRAVENOUS | Status: AC
  Filled 2018-11-24: qty 5

## 2018-11-24 MED ORDER — PHENYLEPHRINE 40 MCG/ML (10ML) SYRINGE FOR IV PUSH (FOR BLOOD PRESSURE SUPPORT)
PREFILLED_SYRINGE | INTRAVENOUS | Status: AC
  Filled 2018-11-24: qty 10

## 2018-11-24 MED ORDER — METRONIDAZOLE IN NACL 5-0.79 MG/ML-% IV SOLN
500.0000 mg | Freq: Once | INTRAVENOUS | Status: AC
  Administered 2018-11-24: 500 mg via INTRAVENOUS
  Filled 2018-11-24: qty 100

## 2018-11-24 MED ORDER — ACETAMINOPHEN 325 MG PO TABS
650.0000 mg | ORAL_TABLET | Freq: Once | ORAL | Status: AC
  Administered 2018-11-24: 650 mg via ORAL
  Filled 2018-11-24: qty 2

## 2018-11-24 MED ORDER — LACTATED RINGERS IV SOLN
INTRAVENOUS | Status: DC | PRN
  Administered 2018-11-24 (×2): via INTRAVENOUS

## 2018-11-24 MED ORDER — DEXAMETHASONE SODIUM PHOSPHATE 10 MG/ML IJ SOLN
INTRAMUSCULAR | Status: DC | PRN
  Administered 2018-11-24: 10 mg via INTRAVENOUS

## 2018-11-24 MED ORDER — FENTANYL CITRATE (PF) 250 MCG/5ML IJ SOLN
INTRAMUSCULAR | Status: DC | PRN
  Administered 2018-11-24 (×5): 50 ug via INTRAVENOUS

## 2018-11-24 MED ORDER — ARTIFICIAL TEARS OPHTHALMIC OINT
TOPICAL_OINTMENT | OPHTHALMIC | Status: AC
  Filled 2018-11-24: qty 3.5

## 2018-11-24 MED ORDER — SUGAMMADEX SODIUM 500 MG/5ML IV SOLN
INTRAVENOUS | Status: AC
  Filled 2018-11-24: qty 5

## 2018-11-24 MED ORDER — ONDANSETRON HCL 4 MG/2ML IJ SOLN
INTRAMUSCULAR | Status: DC | PRN
  Administered 2018-11-24: 4 mg via INTRAVENOUS

## 2018-11-24 MED ORDER — EPHEDRINE 5 MG/ML INJ
INTRAVENOUS | Status: AC
  Filled 2018-11-24: qty 10

## 2018-11-24 MED ORDER — ONDANSETRON HCL 4 MG/2ML IJ SOLN
INTRAMUSCULAR | Status: AC
  Filled 2018-11-24: qty 2

## 2018-11-24 MED ORDER — FENTANYL CITRATE (PF) 100 MCG/2ML IJ SOLN
25.0000 ug | INTRAMUSCULAR | Status: DC | PRN
  Administered 2018-11-25 (×3): 50 ug via INTRAVENOUS

## 2018-11-24 MED ORDER — SUGAMMADEX SODIUM 200 MG/2ML IV SOLN
INTRAVENOUS | Status: DC | PRN
  Administered 2018-11-24: 200 mg via INTRAVENOUS

## 2018-11-24 MED ORDER — ONDANSETRON HCL 4 MG/2ML IJ SOLN: 4.0000 mg | Freq: Once | INTRAMUSCULAR | Status: DC | PRN

## 2018-11-24 MED ORDER — SODIUM CHLORIDE 0.9 % IR SOLN
Status: DC | PRN
  Administered 2018-11-24: 1000 mL

## 2018-11-24 MED ORDER — BUPIVACAINE-EPINEPHRINE 0.25% -1:200000 IJ SOLN
INTRAMUSCULAR | Status: DC | PRN
  Administered 2018-11-24: 10 mL

## 2018-11-24 MED ORDER — FENTANYL CITRATE (PF) 250 MCG/5ML IJ SOLN
INTRAMUSCULAR | Status: AC
  Filled 2018-11-24: qty 5

## 2018-11-24 MED ORDER — DIPHENHYDRAMINE HCL 50 MG/ML IJ SOLN
INTRAMUSCULAR | Status: AC
  Filled 2018-11-24: qty 1

## 2018-11-24 MED ORDER — MIDAZOLAM HCL 5 MG/5ML IJ SOLN
INTRAMUSCULAR | Status: DC | PRN
  Administered 2018-11-24: 2 mg via INTRAVENOUS

## 2018-11-24 MED ORDER — ROCURONIUM BROMIDE 100 MG/10ML IV SOLN
INTRAVENOUS | Status: DC | PRN
  Administered 2018-11-24: 30 mg via INTRAVENOUS

## 2018-11-24 MED ORDER — FENTANYL CITRATE (PF) 100 MCG/2ML IJ SOLN
INTRAMUSCULAR | Status: AC
  Filled 2018-11-24: qty 2

## 2018-11-24 MED ORDER — SODIUM CHLORIDE 0.9 % IV BOLUS
1000.0000 mL | Freq: Once | INTRAVENOUS | Status: AC
  Administered 2018-11-24: 1000 mL via INTRAVENOUS

## 2018-11-24 MED ORDER — 0.9 % SODIUM CHLORIDE (POUR BTL) OPTIME
TOPICAL | Status: DC | PRN
  Administered 2018-11-24: 1000 mL

## 2018-11-24 SURGICAL SUPPLY — 44 items
ADH SKN CLS APL DERMABOND .7 (GAUZE/BANDAGES/DRESSINGS) ×1
APPLIER CLIP 5 13 M/L LIGAMAX5 (MISCELLANEOUS)
APR CLP MED LRG 5 ANG JAW (MISCELLANEOUS)
BAG SPEC RTRVL LRG 6X4 10 (ENDOMECHANICALS) ×1
BLADE CLIPPER SURG (BLADE) IMPLANT
CANISTER SUCT 3000ML PPV (MISCELLANEOUS) ×3 IMPLANT
CHLORAPREP W/TINT 26ML (MISCELLANEOUS) ×3 IMPLANT
CLIP APPLIE 5 13 M/L LIGAMAX5 (MISCELLANEOUS) IMPLANT
COVER SURGICAL LIGHT HANDLE (MISCELLANEOUS) ×3 IMPLANT
COVER WAND RF STERILE (DRAPES) ×3 IMPLANT
CUTTER FLEX LINEAR 45M (STAPLE) ×3 IMPLANT
DERMABOND ADVANCED (GAUZE/BANDAGES/DRESSINGS) ×2
DERMABOND ADVANCED .7 DNX12 (GAUZE/BANDAGES/DRESSINGS) ×1 IMPLANT
DEVICE PMI PUNCTURE CLOSURE (MISCELLANEOUS) ×3 IMPLANT
ELECT REM PT RETURN 9FT ADLT (ELECTROSURGICAL) ×3
ELECTRODE REM PT RTRN 9FT ADLT (ELECTROSURGICAL) ×1 IMPLANT
GLOVE BIO SURGEON STRL SZ 6 (GLOVE) ×3 IMPLANT
GLOVE INDICATOR 6.5 STRL GRN (GLOVE) ×3 IMPLANT
GOWN STRL REUS W/ TWL LRG LVL3 (GOWN DISPOSABLE) ×3 IMPLANT
GOWN STRL REUS W/TWL LRG LVL3 (GOWN DISPOSABLE) ×6
KIT BASIN OR (CUSTOM PROCEDURE TRAY) ×3 IMPLANT
KIT TURNOVER KIT B (KITS) ×3 IMPLANT
NDL INSUFFLATION 14GA 120MM (NEEDLE) ×1 IMPLANT
NEEDLE INSUFFLATION 14GA 120MM (NEEDLE) ×3 IMPLANT
NS IRRIG 1000ML POUR BTL (IV SOLUTION) ×3 IMPLANT
PAD ARMBOARD 7.5X6 YLW CONV (MISCELLANEOUS) ×6 IMPLANT
POUCH SPECIMEN RETRIEVAL 10MM (ENDOMECHANICALS) ×3 IMPLANT
RELOAD 45 VASCULAR/THIN (ENDOMECHANICALS) IMPLANT
RELOAD STAPLE 45 2.5 WHT GRN (ENDOMECHANICALS) IMPLANT
RELOAD STAPLE 45 3.5 BLU ETS (ENDOMECHANICALS) IMPLANT
RELOAD STAPLE TA45 3.5 REG BLU (ENDOMECHANICALS) IMPLANT
SCISSORS ENDO CVD 5DCS (MISCELLANEOUS) IMPLANT
SET IRRIG TUBING LAPAROSCOPIC (IRRIGATION / IRRIGATOR) ×3 IMPLANT
SET TUBE SMOKE EVAC HIGH FLOW (TUBING) ×3 IMPLANT
SHEARS HARMONIC ACE PLUS 36CM (ENDOMECHANICALS) IMPLANT
SLEEVE ENDOPATH XCEL 5M (ENDOMECHANICALS) ×3 IMPLANT
SPECIMEN JAR SMALL (MISCELLANEOUS) ×3 IMPLANT
SUT MNCRL AB 4-0 PS2 18 (SUTURE) ×3 IMPLANT
TOWEL OR 17X24 6PK STRL BLUE (TOWEL DISPOSABLE) ×3 IMPLANT
TRAY FOLEY CATH SILVER 16FR (SET/KITS/TRAYS/PACK) ×3 IMPLANT
TRAY LAPAROSCOPIC MC (CUSTOM PROCEDURE TRAY) ×3 IMPLANT
TROCAR XCEL 12X100 BLDLESS (ENDOMECHANICALS) ×3 IMPLANT
TROCAR XCEL NON-BLD 5MMX100MML (ENDOMECHANICALS) ×3 IMPLANT
WATER STERILE IRR 1000ML POUR (IV SOLUTION) ×3 IMPLANT

## 2018-11-24 NOTE — H&P (Signed)
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 She developed epigastric pain yesterday morning. This was associated with loose bowel movements. Today the pain migrated to the lower abdomen near the midline/ suprapubic region with continued loose bowel movements. She developed subjective fever/chills today. Denies nausea, vomiting, melena, hematochezia, or urinary symptoms.  Of note she was seen here last month for a 5 day hx of upper postprandial abdominal pain and fond to have an elevated lipase thought to be secondary to enzymes she was taking, which she has now stopped.   She has had c-sections x 2 and liposuction.   Allergies  Allergen Reactions  . Codeine Nausea And Vomiting  . Lidocaine Itching    Past Medical History:  Diagnosis Date  . Malachi CarlEpstein Barr infection   . Lyme disease   . Thyroid disease     Past Surgical History:  Procedure Laterality Date  . CESAREAN SECTION    . LIPOSUCTION      No family history on file.  Social History   Socioeconomic History  . Marital status: Married    Spouse name: Not on file  . Number of children: Not on file  . Years of education: Not on file  . Highest education level: Not on file  Occupational History  . Not on file  Social Needs  . Financial resource strain: Not on file  . Food insecurity:    Worry: Not on file    Inability: Not on file  . Transportation needs:    Medical: Not on file    Non-medical: Not on file  Tobacco Use  . Smoking status: Never Smoker  . Smokeless tobacco: Never Used  Substance and Sexual Activity  . Alcohol use: No  . Drug use: No  . Sexual activity: Yes    Birth control/protection: Condom  Lifestyle  . Physical activity:    Days per week: Not on file    Minutes per session: Not on file  . Stress: Not on file   Relationships  . Social connections:    Talks on phone: Not on file    Gets together: Not on file    Attends religious service: Not on file    Active member of club or organization: Not on file    Attends meetings of clubs or organizations: Not on file    Relationship status: Not on file  Other Topics Concern  . Not on file  Social History Narrative  . Not on file    No current facility-administered medications on file prior to encounter.    Current Outpatient Medications on File Prior to Encounter  Medication Sig Dispense Refill  . Ascorbic Acid (VITAMIN C) 100 MG tablet Take 100 mg by mouth daily.    . Biotin 5 MG TABS Take 5 mg by mouth daily.     . calcium-vitamin D (OSCAL WITH D) 500-200 MG-UNIT per tablet Take 1 tablet by mouth daily.    Marland Kitchen. DHEA 10 MG CAPS Take 10 mg by mouth daily.     Marland Kitchen. EC-RX TESTOSTERONE 0.2 % CREA Place 1 application onto the skin every other day.    Marland Kitchen. ESTRACE 0.5 MG tablet Take 1 mg by mouth daily.     . folic acid (FOLVITE) 1 MG tablet Take 1 mg by mouth daily.    Chilton Si. Green Tea, Camillia sinensis, (  GREEN TEA EXTRACT) 90 % LIQD by Does not apply route.    . Inositol 650 MG TABS Take 1 tablet by mouth daily.     . iron polysaccharides (NIFEREX) 150 MG capsule Take 150 mg by mouth 2 (two) times daily.    . magnesium gluconate (MAGONATE) 500 MG tablet Take 500 mg by mouth 2 (two) times daily.    . Methylcobalamin 1 MG CHEW Chew 1 tablet by mouth daily.     . Multiple Vitamin (MULTIVITAMIN) capsule Take 1 capsule by mouth daily.    . Nutritional Supplements (TRANQUIL-EZE PO) Take 1 tablet by mouth daily.     . Omega-3 Fatty Acids (MARINE LIPID CONCENTRATE) 240-360-5 MG-MG-UNIT CAPS Take by mouth.    . ondansetron (ZOFRAN) 4 MG tablet Take 1 tablet (4 mg total) by mouth every 8 (eight) hours as needed for nausea or vomiting. 12 tablet 0  . progesterone (PROMETRIUM) 200 MG capsule Take 400 mg by mouth at bedtime.    Marland Kitchen thyroid (ARMOUR) 65 MG tablet Take 130 mg  by mouth every morning.     . Thyroid 97.5 MG TABS Take 97.5 mg by mouth daily. Mid-day      Review of Systems: a complete, 10pt review of systems was completed with pertinent positives and negatives as documented in the HPI  Physical Exam: Vitals:   11/24/18 2022 11/24/18 2030  BP: (!) 144/78 (!) 132/94  Pulse: (!) 108 (!) 101  Resp: 18   Temp: (!) 100.5 F (38.1 C)   SpO2: 99% 99%   Gen: A&Ox3, no distress  Head: normocephalic, atraumatic Eyes: extraocular motions intact, anicteric.  Neck: supple without mass or thyromegaly Chest: unlabored respirations, symmetrical air entry, clear bilaterally   Cardiovascular: RRR with palpable distal pulses, no pedal edema Abdomen: soft, nondistended, mildly tender in the suprapubic region, no guarding or peritonitis. No mass or organomegaly.  Extremities: warm, without edema, no deformities  Neuro: grossly intact Psych: appropriate mood and affect, normal insight  Skin: warm and dry   CBC Latest Ref Rng & Units 11/24/2018 10/28/2018  WBC 4.0 - 10.5 K/uL 15.4(H) 15.5(H)  Hemoglobin 12.0 - 15.0 g/dL 74.1 16.1(H)  Hematocrit 36.0 - 46.0 % 44.8 49.1(H)  Platelets 150 - 400 K/uL 209 261    CMP Latest Ref Rng & Units 11/24/2018 10/28/2018  Glucose 70 - 99 mg/dL 287(O) 99  BUN 6 - 20 mg/dL 9 16  Creatinine 6.76 - 1.00 mg/dL 7.20 9.47  Sodium 096 - 145 mmol/L 137 139  Potassium 3.5 - 5.1 mmol/L 3.5 3.7  Chloride 98 - 111 mmol/L 103 106  CO2 22 - 32 mmol/L 23 24  Calcium 8.9 - 10.3 mg/dL 9.5 9.4  Total Protein 6.5 - 8.1 g/dL 7.0 7.4  Total Bilirubin 0.3 - 1.2 mg/dL 2.8(Z) 0.7  Alkaline Phos 38 - 126 U/L 78 87  AST 15 - 41 U/L 15 22  ALT 0 - 44 U/L 17 24    No results found for: INR, PROTIME  Imaging: Ct Abdomen Pelvis Wo Contrast  Result Date: 11/24/2018 CLINICAL DATA:  Acute abdominal pain. EXAM: CT ABDOMEN AND PELVIS WITHOUT CONTRAST TECHNIQUE: Multidetector CT imaging of the abdomen and pelvis was performed following the standard  protocol without IV contrast. COMPARISON:  CT 10/28/2018 FINDINGS: Lower chest: Lung bases are clear. Hepatobiliary: No focal liver abnormality is seen. No gallstones, gallbladder wall thickening, or biliary dilatation. Pancreas: Pancreas not well evaluated in the absence of contrast and paucity of intra-abdominal fat. No  ductal dilatation or inflammation. Spleen: Normal in size without focal abnormality. Adrenals/Urinary Tract: No adrenal nodule. No hydronephrosis or perinephric edema. No urolithiasis. Urinary bladder is decompressed and not well assessed given adjacent free fluid Stomach/Bowel: Acute appendicitis as below. Bowel evaluation is limited in the absence of contrast and paucity of intra-abdominal fat. The stomach is nondistended. No evidence of bowel obstruction. Right lower quadrant soft tissue stranding related to appendicitis further limits regional bowel evaluation. Appendix: Location: Pelvic. Diameter: 14 mm Appendicolith: Yes multiple. Mucosal hyper-enhancement: Not assessed given lack of IV contrast. Extraluminal gas: Not definitively visualized. Periappendiceal collection: Free fluid but no organized collection on noncontrast exam. Moderate amount of edema in the right abdomen/pelvis. Dependent free fluid which is also seen on prior exam. Vascular/Lymphatic: Mild aortic atherosclerosis. Multiple prominent retroperitoneal nodes. Limited assessment for adenopathy in the absence of contrast. Reproductive: Pelvic free fluid which was also seen on prior exam. No obvious adnexal mass. Other: Periappendiceal and pelvic fat stranding with free fluid. Free fluid in the pelvic cul-de-sac also seen on prior exam. No definite free air. Musculoskeletal: There are no acute or suspicious osseous abnormalities. Degenerative disc disease at L5-S1. IMPRESSION: Acute appendicitis. Moderate amount of periappendiceal stranding and free fluid the no organized collection. No evidence of perforation, however lack of  contrast and paucity of abdominal fat limits detailed assessment. Electronically Signed   By: Narda Rutherford M.D.   On: 11/24/2018 21:15    A/P: Acute appendicitis. I recommend proceeding with laparoscopic appendectomy. We discussed the surgery including risks of bleeding, infection, pain, scarring, injury to intra-abdominal structures, conversion to open surgery or more extensive resection, risk of staple line leak or delayed abscess, failure to resolve symptoms, postoperative ileus, incisional hernia, as well as general risks of DVT/PE, pneumonia, stroke, heart attack, death. Questions were welcomed and answered to the patient's satisfaction and he husband by phone. We'll proceed to the operating room today.    Phylliss Blakes, MD [facility] Surgery, [place] Pager [phone]

## 2018-11-24 NOTE — Op Note (Signed)
Operative Report  Nancy Castaneda 53 y.o. female  [id number]  [id number]  11/24/2018  Surgeon: Berna Bue MD FACS  Assistant: none  Procedure performed: Laparoscopic Appendectomy  Preop diagnosis: Acute appendicitis  Post-op diagnosis/intraop findings: gangrenous appendix with perforation, purulent peritonitis  Specimens: appendix  EBL: minimal  Complications: none  Description of procedure: After obtaining informed consent the patient was brought to the operating room. Antibiotics were administered. SCD's were applied. General endotracheal anesthesia was initiated and a formal time-out was performed. Foley catheter inserted which is removed at the end of the case. The abdomen was prepped and draped in the usual sterile fashion and the abdomen was entered using an infraumbilical Veress needle and insufflated to 15 mmHg. A 5 mm trocar and camera were then introduced, the abdomen was inspected and there is no evidence of injury from our entry. The serosa of the small bowel and mesentery appears injected and there is purulent fluid in the pelvis. A suprapubic 5 mm trocar and a left lower quadrant 12 mm trocar were introduced under direct visualization following infiltration with local. The patient was then placed in Trendelenburg and rotated to the left and the small bowel was reflected cephalad. An omental adhesion to the appendix was divided with the harmonic scalpel. The terminal ileum and cecum were gently flipped up cephalad so that the appendix could be identified. The appendix is gangrenous and curled back on itself behind the cecum, with inflammatory adhesion to the cecum and ileal sail. Gentle blunt dissection was able to peel the appendix away from the adjacent bowel and lift it up. It is gangrenous with perforation expelling feculent material along the mid-body, fortunately the base of the appendix did appear viable. Great care was taken to ensure no injury to surrounding  retroperitoneal structures, cecum or terminal ileum. A window was created at the base of the appendix and a blue load linear cutting stapler was used to transect the appendix from the cecum. The Harmonic scalpel was used transect the appendiceal mesentery. Hemostasis was ensured. The appendix was placed in an Endo Catch bag and removed through our 12 mm trocar site. The purulent fluid was aspirated along with the feculent drainage from the appendix and then the right lower quadrant and pelvis and abdomen were irrigated with warm sterile saline. The effluent was clear. The staple line was reinspected and confirmed to be hemostatic, viable and intact. The omentum was brought down over the staple line. The 68mm trocar site in the left lower quadrant was closed with a 0 vicryl in the fascia under direct visualization using a PMI device. The abdomen was desufflated and all trocars removed. The skin incisions were closed with running subcuticular monocryl and Dermabond. The patient was awakened, extubated and transported to the recovery room in stable condition.   All counts were correct at the completion of the case.

## 2018-11-24 NOTE — Anesthesia Preprocedure Evaluation (Addendum)
Anesthesia Evaluation  Patient identified by MRN, date of birth, ID band Patient awake    Reviewed: Allergy & Precautions, NPO status , Patient's Chart, lab work & pertinent test results  Airway Mallampati: I  TM Distance: >3 FB Neck ROM: Full    Dental  (+) Teeth Intact, Dental Advisory Given   Pulmonary    breath sounds clear to auscultation       Cardiovascular  Rhythm:Regular Rate:Normal     Neuro/Psych    GI/Hepatic   Endo/Other    Renal/GU      Musculoskeletal   Abdominal   Peds  Hematology   Anesthesia Other Findings   Reproductive/Obstetrics                             Anesthesia Physical Anesthesia Plan  ASA: II and emergent  Anesthesia Plan: General   Post-op Pain Management:    Induction: Intravenous, Rapid sequence and Cricoid pressure planned  PONV Risk Score and Plan: Ondansetron, Dexamethasone and Scopolamine patch - Pre-op  Airway Management Planned: Oral ETT  Additional Equipment:   Intra-op Plan:   Post-operative Plan: Extubation in OR  Informed Consent: I have reviewed the patients History and Physical, chart, labs and discussed the procedure including the risks, benefits and alternatives for the proposed anesthesia with the patient or authorized representative who has indicated his/her understanding and acceptance.     Dental advisory given  Plan Discussed with: CRNA, Anesthesiologist and Surgeon  Anesthesia Plan Comments:        Anesthesia Quick Evaluation

## 2018-11-24 NOTE — ED Provider Notes (Signed)
[facility]   CSN: [id number] Arrival date & time: 11/24/18  2012    History   Chief Complaint Chief Complaint  Patient presents with   Abdominal Pain    HPI Peggyann Jubaeresa B Vester is a 53 y.o. female with a past medical history of MTHFR gene mutation, who presents to ED for abdominal pain.  States that yesterday she started having epigastric abdominal pain with loose bowel movements.  Pain is now migrated and located in her lower abdomen.  She continues to have diarrhea but denies any blood in her stool.  Denies any nausea or vomiting.  Reports subjective fever/chills at home.  Denies any vaginal complaints, urinary symptoms.  Prior abdominal surgeries include C-section x2.  Denies any alcohol, tobacco or other drug use.  Of note, patient does take numerous supplements as prescribed by her PCP.       HPI  Past Medical History:  Diagnosis Date   Malachi Carlpstein Barr infection    Lyme disease    Thyroid disease     There are no active problems to display for this patient.   Past Surgical History:  Procedure Laterality Date   CESAREAN SECTION     LIPOSUCTION       OB History   No obstetric history on file.      Home Medications    Prior to Admission medications   Medication Sig Start Date End Date Taking? Authorizing Provider  Ascorbic Acid (VITAMIN C) 100 MG tablet Take 100 mg by mouth daily.   Yes [provider]  Biotin 5 MG TABS Take 5 mg by mouth daily.    Yes [provider]  calcium-vitamin D (OSCAL WITH D) 500-200 MG-UNIT per tablet Take 1 tablet by mouth daily.   Yes [provider]  DHEA 10 MG CAPS Take 10 mg by mouth daily.    Yes [provider]  EC-RX TESTOSTERONE 0.2 % CREA Place 1 application onto the skin every other day.   Yes [provider]  ESTRACE 0.5 MG tablet Take 1 mg by mouth daily.  10/04/18  Yes [provider]  folic acid (FOLVITE) 1 MG  tablet Take 1 mg by mouth daily.   Yes [provider]  Janett BillowGreen Tea, Camillia sinensis, (GREEN TEA EXTRACT) 90 % LIQD by Does not apply route.   Yes [provider]  Inositol 650 MG TABS Take 1 tablet by mouth daily.    Yes [provider]  iron polysaccharides (NIFEREX) 150 MG capsule Take 150 mg by mouth 2 (two) times daily.   Yes [provider]  magnesium gluconate (MAGONATE) 500 MG tablet Take 500 mg by mouth 2 (two) times daily.   Yes [provider]  Methylcobalamin 1 MG CHEW Chew 1 tablet by mouth daily.    Yes [provider]  Multiple Vitamin (MULTIVITAMIN) capsule Take 1 capsule by mouth daily.   Yes [provider]  Nutritional Supplements (TRANQUIL-EZE PO) Take 1 tablet by mouth daily.    Yes [provider]  Omega-3 Fatty Acids (MARINE LIPID CONCENTRATE) 240-360-5 MG-MG-UNIT CAPS Take by mouth.   Yes [provider]  ondansetron (ZOFRAN) 4 MG tablet Take 1 tablet (4 mg total) by mouth every 8 (eight) hours as needed for nausea or vomiting. 10/28/18  Yes Fayrene Helperran, [place], PA-C  progesterone (PROMETRIUM) 200 MG capsule Take 400 mg by mouth at bedtime. 08/19/18  Yes [provider]  thyroid (ARMOUR) 65 MG tablet  Take 130 mg by mouth every morning.    Yes [provider]  Thyroid 97.5 MG TABS Take 97.5 mg by mouth daily. Mid-day   Yes [provider]    Family History No family history on file.  Social History Social History   Tobacco Use   Smoking status: Never Smoker   Smokeless tobacco: Never Used  Substance Use Topics   Alcohol use: No   Drug use: No     Allergies   Codeine and Lidocaine   Review of Systems Review of Systems  Constitutional: Negative for appetite change, chills and fever.  HENT: Negative for ear pain, rhinorrhea, sneezing and sore throat.   Eyes: Negative for photophobia and visual disturbance.  Respiratory: Negative for cough, chest tightness,  shortness of breath and wheezing.   Cardiovascular: Negative for chest pain and palpitations.  Gastrointestinal: Positive for abdominal pain and diarrhea. Negative for blood in stool, constipation, nausea and vomiting.  Genitourinary: Negative for dysuria, hematuria and urgency.  Musculoskeletal: Negative for myalgias.  Skin: Negative for rash.  Neurological: Negative for dizziness, weakness and light-headedness.     Physical Exam Updated Vital Signs BP (!) 132/94    Pulse (!) 101    Temp (!) 100.5 F (38.1 C) (Oral)    Resp 18    Ht 5\' 8"  (1.727 m)    Wt 73.5 kg    LMP 12/07/2011    SpO2 99%    BMI 24.63 kg/m   Physical Exam Vitals signs and nursing note reviewed.  Constitutional:      General: She is not in acute distress.    Appearance: She is well-developed.  HENT:     Head: Normocephalic and atraumatic.     Nose: Nose normal.  Eyes:     General: No scleral icterus.       Left eye: No discharge.     Conjunctiva/sclera: Conjunctivae normal.  Neck:     Musculoskeletal: Normal range of motion and neck supple.  Cardiovascular:     Rate and Rhythm: Normal rate and regular rhythm.     Heart sounds: Normal heart sounds. No murmur. No friction rub. No gallop.   Pulmonary:     Effort: Pulmonary effort is normal. No respiratory distress.     Breath sounds: Normal breath sounds.  Abdominal:     General: Bowel sounds are normal. There is no distension.     Palpations: Abdomen is soft.     Tenderness: There is abdominal tenderness in the suprapubic area. There is no guarding.  Musculoskeletal: Normal range of motion.  Skin:    General: Skin is warm and dry.     Findings: No rash.  Neurological:     Mental Status: She is alert.     Motor: No abnormal muscle tone.     Coordination: Coordination normal.      ED Treatments / Results  Labs (all labs ordered are listed, but only abnormal results are displayed) Labs Reviewed  COMPREHENSIVE METABOLIC PANEL - Abnormal; Notable  for the following components:      Result Value   Glucose, Bld 102 (*)    Total Bilirubin 1.3 (*)    All other components within normal limits  URINALYSIS, ROUTINE W REFLEX MICROSCOPIC - Abnormal; Notable for the following components:   Ketones, ur 80 (*)    All other components within normal limits  CBC WITH DIFFERENTIAL/PLATELET - Abnormal; Notable for the following components:   WBC 15.4 (*)    Neutro Abs  12.4 (*)    Monocytes Absolute 1.1 (*)    Abs Immature Granulocytes 0.09 (*)    All other components within normal limits  URINE CULTURE  LIPASE, BLOOD  CBC WITH DIFFERENTIAL/PLATELET    EKG None  Radiology Ct Abdomen Pelvis Wo Contrast  Result Date: 11/24/2018 CLINICAL DATA:  Acute abdominal pain. EXAM: CT ABDOMEN AND PELVIS WITHOUT CONTRAST TECHNIQUE: Multidetector CT imaging of the abdomen and pelvis was performed following the standard protocol without IV contrast. COMPARISON:  CT 10/28/2018 FINDINGS: Lower chest: Lung bases are clear. Hepatobiliary: No focal liver abnormality is seen. No gallstones, gallbladder wall thickening, or biliary dilatation. Pancreas: Pancreas not well evaluated in the absence of contrast and paucity of intra-abdominal fat. No ductal dilatation or inflammation. Spleen: Normal in size without focal abnormality. Adrenals/Urinary Tract: No adrenal nodule. No hydronephrosis or perinephric edema. No urolithiasis. Urinary bladder is decompressed and not well assessed given adjacent free fluid Stomach/Bowel: Acute appendicitis as below. Bowel evaluation is limited in the absence of contrast and paucity of intra-abdominal fat. The stomach is nondistended. No evidence of bowel obstruction. Right lower quadrant soft tissue stranding related to appendicitis further limits regional bowel evaluation. Appendix: Location: Pelvic. Diameter: 14 mm Appendicolith: Yes multiple. Mucosal hyper-enhancement: Not assessed given lack of IV contrast. Extraluminal gas: Not  definitively visualized. Periappendiceal collection: Free fluid but no organized collection on noncontrast exam. Moderate amount of edema in the right abdomen/pelvis. Dependent free fluid which is also seen on prior exam. Vascular/Lymphatic: Mild aortic atherosclerosis. Multiple prominent retroperitoneal nodes. Limited assessment for adenopathy in the absence of contrast. Reproductive: Pelvic free fluid which was also seen on prior exam. No obvious adnexal mass. Other: Periappendiceal and pelvic fat stranding with free fluid. Free fluid in the pelvic cul-de-sac also seen on prior exam. No definite free air. Musculoskeletal: There are no acute or suspicious osseous abnormalities. Degenerative disc disease at L5-S1. IMPRESSION: Acute appendicitis. Moderate amount of periappendiceal stranding and free fluid the no organized collection. No evidence of perforation, however lack of contrast and paucity of abdominal fat limits detailed assessment. Electronically Signed   By: Narda Rutherford M.D.   On: 11/24/2018 21:15    Procedures Procedures (including critical care time)  CRITICAL CARE Performed by: Dietrich Pates   Total critical care time: 35 minutes  Critical care time was exclusive of separately billable procedures and treating other patients.  Critical care was necessary to treat or prevent imminent or life-threatening deterioration.  Critical care was time spent personally by me on the following activities: development of treatment plan with patient and/or surrogate as well as nursing, discussions with consultants, evaluation of patient's response to treatment, examination of patient, obtaining history from patient or surrogate, ordering and performing treatments and interventions, ordering and review of laboratory studies, ordering and review of radiographic studies, pulse oximetry and re-evaluation of patient's condition.   Medications Ordered in ED Medications  cefTRIAXone (ROCEPHIN) 2 g in  sodium chloride 0.9 % 100 mL IVPB (has no administration in time range)    And  metroNIDAZOLE (FLAGYL) IVPB 500 mg (has no administration in time range)  sodium chloride 0.9 % bolus 1,000 mL (1,000 mLs Intravenous New Bag/Given 11/24/18 2108)  acetaminophen (TYLENOL) tablet 650 mg (650 mg Oral Given 11/24/18 2108)     Initial Impression / Assessment and Plan / ED Course  I have reviewed the triage vital signs and the nursing notes.  Pertinent labs & imaging results that were available during my care of the patient were  reviewed by me and considered in my medical decision making (see chart for details).        53 year old female presents to ED for abdominal pain that began yesterday.  Started epigastric and now located in her suprapubic area.  Reports associated diarrhea.  Febrile to 100.5 here on arrival.  No recent use of antipyretics.  CT of the abdomen pelvis done 1 month ago shows enlarged appendix at the upper limit of normal with appendicoliths.  Repeat CT done shows acute appendicitis.  Leukocytosis of 15.  Patient started on antibiotics, fluids and will be evaluated by general surgery Dr. Fredricka Bonine.    Final Clinical Impressions(s) / ED Diagnoses   Final diagnoses:  Acute appendicitis, unspecified acute appendicitis type    ED Discharge Orders    None      Portions of this note were generated with Dragon dictation software. Dictation errors may occur despite best attempts at proofreading.    Dietrich Pates, PA-C 11/24/18 2147    Gwyneth Sprout, MD 11/27/18 1021

## 2018-11-24 NOTE — ED Triage Notes (Signed)
The pt is c/o abd pain since yesterday  She was seen at [facility] earlier today but she was not seen b y a doctor  She has several pmh  No nausea or vomiting  Elevated temp

## 2018-11-24 NOTE — Anesthesia Procedure Notes (Signed)
Procedure Name: Intubation Date/Time: 11/24/2018 11:08 PM Performed by: Claudina Lick, CRNA Pre-anesthesia Checklist: Patient identified, Emergency Drugs available, Suction available, Patient being monitored and Timeout performed Patient Re-evaluated:Patient Re-evaluated prior to induction Oxygen Delivery Method: Circle system utilized Preoxygenation: Pre-oxygenation with 100% oxygen Induction Type: IV induction, Rapid sequence and Cricoid Pressure applied Laryngoscope Size: Miller and 2 Grade View: Grade I Tube type: Oral Tube size: 7.0 mm Number of attempts: 1 Airway Equipment and Method: Stylet Placement Confirmation: ETT inserted through vocal cords under direct vision,  positive ETCO2 and breath sounds checked- equal and bilateral Secured at: 21 cm Tube secured with: Tape Dental Injury: Teeth and Oropharynx as per pre-operative assessment

## 2018-11-24 NOTE — ED Notes (Signed)
Patient has had abdominal pain for 24 hours.  Pain initially center epigastric area.  Now pain in center, low abdominal area.  No burning with urination.  Patient has had loose stool.  Patient is doubled over in chair.  Patient has a complicated history.  Patient discussed with dr hagler.  Patient agreed to go to ed.

## 2018-11-25 ENCOUNTER — Other Ambulatory Visit: Payer: Self-pay

## 2018-11-25 ENCOUNTER — Encounter (HOSPITAL_COMMUNITY): Payer: Self-pay | Admitting: Surgery

## 2018-11-25 LAB — CBC
HCT: 38.6 % (ref 36.0–46.0)
Hemoglobin: 12.9 g/dL (ref 12.0–15.0)
MCH: 30.3 pg (ref 26.0–34.0)
MCHC: 33.4 g/dL (ref 30.0–36.0)
MCV: 90.6 fL (ref 80.0–100.0)
Platelets: 198 10*3/uL (ref 150–400)
RBC: 4.26 MIL/uL (ref 3.87–5.11)
RDW: 12 % (ref 11.5–15.5)
WBC: 11 10*3/uL — ABNORMAL HIGH (ref 4.0–10.5)
nRBC: 0 % (ref 0.0–0.2)

## 2018-11-25 LAB — URINE CULTURE

## 2018-11-25 LAB — BASIC METABOLIC PANEL
Anion gap: 9 (ref 5–15)
BUN: 8 mg/dL (ref 6–20)
CO2: 21 mmol/L — ABNORMAL LOW (ref 22–32)
Calcium: 8.9 mg/dL (ref 8.9–10.3)
Chloride: 107 mmol/L (ref 98–111)
Creatinine, Ser: 0.67 mg/dL (ref 0.44–1.00)
GFR calc Af Amer: >60 mL/min (ref >60)
GFR calc non Af Amer: >60 mL/min (ref >60)
Glucose, Bld: 122 mg/dL — ABNORMAL HIGH (ref 70–99)
Potassium: 3.7 mmol/L (ref 3.5–5.1)
Sodium: 137 mmol/L (ref 135–145)

## 2018-11-25 MED ORDER — ENOXAPARIN SODIUM 40 MG/0.4ML ~~LOC~~ SOLN: 40.0000 mg | Freq: Every day | SUBCUTANEOUS | Status: DC

## 2018-11-25 MED ORDER — KETOROLAC TROMETHAMINE 30 MG/ML IJ SOLN
INTRAMUSCULAR | Status: AC
  Filled 2018-11-25: qty 1

## 2018-11-25 MED ORDER — AMOXICILLIN-POT CLAVULANATE 875-125 MG PO TABS: 1.0000 | ORAL_TABLET | Freq: Two times a day (BID) | ORAL | 0 refills | Status: AC

## 2018-11-25 MED ORDER — ACETAMINOPHEN 325 MG PO TABS: 650.0000 mg | ORAL_TABLET | Freq: Four times a day (QID) | ORAL | 0 refills | Status: AC | PRN

## 2018-11-25 MED ORDER — SUGAMMADEX SODIUM 500 MG/5ML IV SOLN
INTRAVENOUS | Status: AC
  Filled 2018-11-25: qty 5

## 2018-11-25 MED ORDER — DIPHENHYDRAMINE HCL 25 MG PO CAPS: 25.0000 mg | ORAL_CAPSULE | Freq: Four times a day (QID) | ORAL | Status: DC | PRN

## 2018-11-25 MED ORDER — THYROID 97.5 MG PO TABS: 97.5000 mg | ORAL_TABLET | Freq: Every day | ORAL | Status: DC

## 2018-11-25 MED ORDER — THYROID 120 MG PO TABS
120.0000 mg | ORAL_TABLET | Freq: Every day | ORAL | Status: DC
  Filled 2018-11-25: qty 1

## 2018-11-25 MED ORDER — MIDAZOLAM HCL 2 MG/2ML IJ SOLN
INTRAMUSCULAR | Status: AC
  Filled 2018-11-25: qty 2

## 2018-11-25 MED ORDER — THYROID 30 MG PO TABS: 130.0000 mg | ORAL_TABLET | ORAL | Status: DC

## 2018-11-25 MED ORDER — SODIUM CHLORIDE 0.9 % IV SOLN
2.0000 g | INTRAVENOUS | Status: DC
  Filled 2018-11-25: qty 20

## 2018-11-25 MED ORDER — HYDROMORPHONE HCL 1 MG/ML IJ SOLN: 0.5000 mg | INTRAMUSCULAR | Status: DC | PRN

## 2018-11-25 MED ORDER — DOCUSATE SODIUM 100 MG PO CAPS
100.0000 mg | ORAL_CAPSULE | Freq: Two times a day (BID) | ORAL | Status: DC
  Administered 2018-11-25 (×2): 100 mg via ORAL
  Filled 2018-11-25 (×2): qty 1

## 2018-11-25 MED ORDER — FENTANYL CITRATE (PF) 250 MCG/5ML IJ SOLN
INTRAMUSCULAR | Status: AC
  Filled 2018-11-25: qty 5

## 2018-11-25 MED ORDER — ONDANSETRON HCL 4 MG/2ML IJ SOLN: 4.0000 mg | Freq: Once | INTRAMUSCULAR | Status: DC | PRN

## 2018-11-25 MED ORDER — ONDANSETRON HCL 4 MG/2ML IJ SOLN
INTRAMUSCULAR | Status: AC
  Filled 2018-11-25: qty 2

## 2018-11-25 MED ORDER — METHOCARBAMOL 500 MG PO TABS: 500.0000 mg | ORAL_TABLET | Freq: Four times a day (QID) | ORAL | Status: DC | PRN

## 2018-11-25 MED ORDER — KETOROLAC TROMETHAMINE 30 MG/ML IJ SOLN
30.0000 mg | Freq: Once | INTRAMUSCULAR | Status: AC | PRN
  Administered 2018-11-25: 30 mg via INTRAVENOUS

## 2018-11-25 MED ORDER — HYDROMORPHONE HCL 1 MG/ML IJ SOLN: 0.2500 mg | INTRAMUSCULAR | Status: DC | PRN

## 2018-11-25 MED ORDER — SIMETHICONE 80 MG PO CHEW: 40.0000 mg | CHEWABLE_TABLET | Freq: Four times a day (QID) | ORAL | Status: DC | PRN

## 2018-11-25 MED ORDER — MIDAZOLAM HCL 2 MG/2ML IJ SOLN
2.0000 mg | Freq: Once | INTRAMUSCULAR | Status: AC
  Administered 2018-11-25: 2 mg via INTRAVENOUS

## 2018-11-25 MED ORDER — IBUPROFEN 600 MG PO TABS: 600.0000 mg | ORAL_TABLET | Freq: Four times a day (QID) | ORAL | Status: DC | PRN

## 2018-11-25 MED ORDER — ONDANSETRON HCL 4 MG/2ML IJ SOLN: 4.0000 mg | Freq: Four times a day (QID) | INTRAMUSCULAR | Status: DC | PRN

## 2018-11-25 MED ORDER — SODIUM CHLORIDE 0.9 % IV SOLN
INTRAVENOUS | Status: DC
  Administered 2018-11-25 (×2): via INTRAVENOUS

## 2018-11-25 MED ORDER — THYROID 60 MG PO TABS
90.0000 mg | ORAL_TABLET | ORAL | Status: DC
  Filled 2018-11-25: qty 1

## 2018-11-25 MED ORDER — POLYETHYLENE GLYCOL 3350 17 G PO PACK: 17.0000 g | PACK | Freq: Every day | ORAL | 0 refills | Status: AC | PRN

## 2018-11-25 MED ORDER — METRONIDAZOLE IN NACL 5-0.79 MG/ML-% IV SOLN
500.0000 mg | Freq: Three times a day (TID) | INTRAVENOUS | Status: DC
  Administered 2018-11-25 (×2): 500 mg via INTRAVENOUS
  Filled 2018-11-25 (×2): qty 100

## 2018-11-25 MED ORDER — DOCUSATE SODIUM 100 MG PO CAPS: 100.0000 mg | ORAL_CAPSULE | Freq: Every day | ORAL | 2 refills | Status: AC | PRN

## 2018-11-25 MED ORDER — ROCURONIUM BROMIDE 50 MG/5ML IV SOSY
PREFILLED_SYRINGE | INTRAVENOUS | Status: AC
  Filled 2018-11-25: qty 5

## 2018-11-25 MED ORDER — TRAMADOL HCL 50 MG PO TABS: 50.0000 mg | ORAL_TABLET | Freq: Four times a day (QID) | ORAL | 0 refills | Status: AC | PRN

## 2018-11-25 MED ORDER — LIDOCAINE 2% (20 MG/ML) 5 ML SYRINGE
INTRAMUSCULAR | Status: AC
  Filled 2018-11-25: qty 5

## 2018-11-25 MED ORDER — SUCCINYLCHOLINE CHLORIDE 200 MG/10ML IV SOSY
PREFILLED_SYRINGE | INTRAVENOUS | Status: AC
  Filled 2018-11-25: qty 10

## 2018-11-25 MED ORDER — DEXAMETHASONE SODIUM PHOSPHATE 10 MG/ML IJ SOLN
INTRAMUSCULAR | Status: AC
  Filled 2018-11-25: qty 1

## 2018-11-25 MED ORDER — ACETAMINOPHEN 500 MG PO TABS
1000.0000 mg | ORAL_TABLET | Freq: Four times a day (QID) | ORAL | Status: DC
  Administered 2018-11-25 (×3): 1000 mg via ORAL
  Filled 2018-11-25 (×3): qty 2

## 2018-11-25 MED ORDER — FENTANYL CITRATE (PF) 100 MCG/2ML IJ SOLN
INTRAMUSCULAR | Status: AC
  Filled 2018-11-25: qty 2

## 2018-11-25 MED ORDER — ONDANSETRON 4 MG PO TBDP: 4.0000 mg | ORAL_TABLET | Freq: Four times a day (QID) | ORAL | Status: DC | PRN

## 2018-11-25 MED ORDER — WHITE PETROLATUM EX OINT
TOPICAL_OINTMENT | CUTANEOUS | Status: AC
  Administered 2018-11-25: 1
  Filled 2018-11-25: qty 28.35

## 2018-11-25 MED ORDER — TRAMADOL HCL 50 MG PO TABS: 50.0000 mg | ORAL_TABLET | Freq: Four times a day (QID) | ORAL | Status: DC | PRN

## 2018-11-25 MED ORDER — PROPOFOL 10 MG/ML IV BOLUS
INTRAVENOUS | Status: AC
  Filled 2018-11-25: qty 20

## 2018-11-25 MED ORDER — ONDANSETRON 4 MG PO TBDP: 4.0000 mg | ORAL_TABLET | Freq: Four times a day (QID) | ORAL | 0 refills | Status: AC | PRN

## 2018-11-25 MED ORDER — DIPHENHYDRAMINE HCL 50 MG/ML IJ SOLN: 25.0000 mg | Freq: Four times a day (QID) | INTRAMUSCULAR | Status: DC | PRN

## 2018-11-25 NOTE — Progress Notes (Signed)
[facility]/Trauma Progress Note  1 Day Post-Op   Assessment/Plan  Perforated, gangrenous appendicitis - S/P lap appy, Dr. Fredricka Bonine, 03/25   FEN: reg diet VTE: SCD's, lovenox ID: Rocephin & Flagyl 03/25>> Foley: none Follow up: [facility] 2 weeks  DISPO: pt needs to eat, ambulate and pass gas before she can be discharged. Will recheck on her this afternoon    LOS: 0 days    Subjective: CC: abdominal pain  Pt has not ambulated nor eaten since surgery. She has not had flatus yet today. I discussed risk of ileus and abscess with pt. She states she just finished Minocycline for lyme disease a month ago. She states she gets nauseated with antibiotics but she is not sure what ones have caused nausea in the past.     Objective: Vital signs in last 24 hours: Temp:  [98.1 F (36.7 C)-100.5 F (38.1 C)] 98.5 F (36.9 C) (03/26 0506) Pulse Rate:  [74-141] 74 (03/26 0506) Resp:  [17-20] 17 (03/26 0506) BP: (109-150)/(65-104) 111/65 (03/26 0506) SpO2:  [94 %-100 %] 98 % (03/26 0506) Weight:  [73.5 kg] 73.5 kg (03/25 2021) Last BM Date: 11/24/18  Intake/Output from previous day: 03/25 0701 - 03/26 0700 In: 2183.9 [P.O.:300; I.V.:1670.8; IV Piggyback:213.1] Out: 225 [Urine:200; Blood:25] Intake/Output this shift: No intake/output data recorded.  PE: Gen:  Alert, NAD, pleasant, cooperative Card:  RRR, no M/G/R heard Pulm:  CTA, no W/R/R, rate and effort normal Abd: Soft, NT/ND, +BS, incisions with glue intact are well appearing and are without drainage or bleeding.  Skin: no rashes noted, warm and dry   Anti-infectives: Anti-infectives (From admission, onward)   Start     Dose/Rate Route Frequency Ordered Stop   11/25/18 2200  cefTRIAXone (ROCEPHIN) 2 g in sodium chloride 0.9 % 100 mL IVPB     2 g 200 mL/hr over 30 Minutes Intravenous Every 24 hours 11/25/18 0059     11/25/18 0600  metroNIDAZOLE (FLAGYL) IVPB 500 mg     500 mg 100 mL/hr over 60 Minutes Intravenous  Every 8 hours 11/25/18 0059     11/24/18 2130  cefTRIAXone (ROCEPHIN) 2 g in sodium chloride 0.9 % 100 mL IVPB     2 g 200 mL/hr over 30 Minutes Intravenous  Once 11/24/18 2122 11/24/18 2309   11/24/18 2130  metroNIDAZOLE (FLAGYL) IVPB 500 mg     500 mg 100 mL/hr over 60 Minutes Intravenous  Once 11/24/18 2122 11/24/18 2319      Lab Results:  Recent Labs    11/24/18 2115 11/25/18 0248  WBC 15.4* 11.0*  HGB 14.6 12.9  HCT 44.8 38.6  PLT 209 198   BMET Recent Labs    11/24/18 2027 11/25/18 0248  NA 137 137  K 3.5 3.7  CL 103 107  CO2 23 21*  GLUCOSE 102* 122*  BUN 9 8  CREATININE 0.72 0.67  CALCIUM 9.5 8.9   PT/INR No results for input(s): LABPROT, INR in the last 72 hours. CMP     Component Value Date/Time   NA 137 11/25/2018 0248   K 3.7 11/25/2018 0248   CL 107 11/25/2018 0248   CO2 21 (L) 11/25/2018 0248   GLUCOSE 122 (H) 11/25/2018 0248   BUN 8 11/25/2018 0248   CREATININE 0.67 11/25/2018 0248   CALCIUM 8.9 11/25/2018 0248   PROT 7.0 11/24/2018 2027   ALBUMIN 3.8 11/24/2018 2027   AST 15 11/24/2018 2027   ALT 17 11/24/2018 2027   ALKPHOS 78 11/24/2018  2027   BILITOT 1.3 (H) 11/24/2018 2027   GFRNONAA >60 11/25/2018 0248   GFRAA >60 11/25/2018 0248   Lipase     Component Value Date/Time   LIPASE 21 11/24/2018 2027    Studies/Results: Ct Abdomen Pelvis Wo Contrast  Result Date: 11/24/2018 CLINICAL DATA:  Acute abdominal pain. EXAM: CT ABDOMEN AND PELVIS WITHOUT CONTRAST TECHNIQUE: Multidetector CT imaging of the abdomen and pelvis was performed following the standard protocol without IV contrast. COMPARISON:  CT 10/28/2018 FINDINGS: Lower chest: Lung bases are clear. Hepatobiliary: No focal liver abnormality is seen. No gallstones, gallbladder wall thickening, or biliary dilatation. Pancreas: Pancreas not well evaluated in the absence of contrast and paucity of intra-abdominal fat. No ductal dilatation or inflammation. Spleen: Normal in size without  focal abnormality. Adrenals/Urinary Tract: No adrenal nodule. No hydronephrosis or perinephric edema. No urolithiasis. Urinary bladder is decompressed and not well assessed given adjacent free fluid Stomach/Bowel: Acute appendicitis as below. Bowel evaluation is limited in the absence of contrast and paucity of intra-abdominal fat. The stomach is nondistended. No evidence of bowel obstruction. Right lower quadrant soft tissue stranding related to appendicitis further limits regional bowel evaluation. Appendix: Location: Pelvic. Diameter: 14 mm Appendicolith: Yes multiple. Mucosal hyper-enhancement: Not assessed given lack of IV contrast. Extraluminal gas: Not definitively visualized. Periappendiceal collection: Free fluid but no organized collection on noncontrast exam. Moderate amount of edema in the right abdomen/pelvis. Dependent free fluid which is also seen on prior exam. Vascular/Lymphatic: Mild aortic atherosclerosis. Multiple prominent retroperitoneal nodes. Limited assessment for adenopathy in the absence of contrast. Reproductive: Pelvic free fluid which was also seen on prior exam. No obvious adnexal mass. Other: Periappendiceal and pelvic fat stranding with free fluid. Free fluid in the pelvic cul-de-sac also seen on prior exam. No definite free air. Musculoskeletal: There are no acute or suspicious osseous abnormalities. Degenerative disc disease at L5-S1. IMPRESSION: Acute appendicitis. Moderate amount of periappendiceal stranding and free fluid the no organized collection. No evidence of perforation, however lack of contrast and paucity of abdominal fat limits detailed assessment. Electronically Signed   By: Narda Rutherford M.D.   On: 11/24/2018 21:15      Jerre Simon , [facility] 11/25/2018, 9:16 AM  Pager: [phone] Mon-Wed, Friday 7:00am-4:30pm Thurs 7am-11:30am  Consults: [phone]

## 2018-11-25 NOTE — Progress Notes (Signed)
Pt discharged home in stable condition 

## 2018-11-25 NOTE — Anesthesia Postprocedure Evaluation (Signed)
Anesthesia Post Note  Patient: Nancy Castaneda  Procedure(s) Performed: APPENDECTOMY LAPAROSCOPIC (N/A Abdomen)     Patient location during evaluation: PACU Anesthesia Type: General Level of consciousness: awake and alert Pain management: pain level controlled Vital Signs Assessment: post-procedure vital signs reviewed and stable Respiratory status: spontaneous breathing, nonlabored ventilation, respiratory function stable and patient connected to nasal cannula oxygen Cardiovascular status: blood pressure returned to baseline and stable Postop Assessment: no apparent nausea or vomiting Anesthetic complications: no    Last Vitals:  Vitals:   11/25/18 0059 11/25/18 0506  BP: 109/65 111/65  Pulse: 97 74  Resp: 19 17  Temp: 37.2 C 36.9 C  SpO2: 94% 98%    Last Pain:  Vitals:   11/25/18 0547  TempSrc:   PainSc: 4                  Spiros Greenfeld COKER

## 2018-11-25 NOTE — Transfer of Care (Signed)
Immediate Anesthesia Transfer of Care Note  Patient: Nancy Castaneda  Procedure(s) Performed: APPENDECTOMY LAPAROSCOPIC (N/A Abdomen)  Patient Location: PACU  Anesthesia Type:General  Level of Consciousness: awake  Airway & Oxygen Therapy: Patient Spontanous Breathing  Post-op Assessment: Report given to RN and Post -op Vital signs reviewed and stable  Post vital signs: Reviewed and stable  Last Vitals:  Vitals Value Taken Time  BP    Temp    Pulse 114 11/25/2018 12:04 AM  Resp 16 11/25/2018 12:04 AM  SpO2 98 % 11/25/2018 12:04 AM  Vitals shown include unvalidated device data.  Last Pain:  Vitals:   11/24/18 2022  TempSrc: Oral  PainSc:          Complications: No apparent anesthesia complications

## 2018-11-25 NOTE — Discharge Summary (Signed)
Patient ID: BETTI DORIN [id number] 1966-01-14 53 y.o.  Admit date: 11/24/2018 Discharge date: 11/25/2018  Admitting Diagnosis: Acute Appendictis  Discharge Diagnosis: Gangrenous appendix with perforation, purulent peritonitis  Discharge Diagnosis Patient Active Problem List   Diagnosis Date Noted  . Acute appendicitis 11/24/2018    Consultants None  Procedures Laparoscopic Appendectomy, 11/25/2018, Jaclyn Shaggy FACS  Surgical Pathology [facility] Course:  The patient presented to [facility] on 3/25 with epigastric abdominal pain that migrated to her lower abdomen and was associated with subjective fever and chills. She was found on CT to have Acute Appendicitis. The patient was admitted and underwent a laparoscopic appendectomy. This was found to be a gangrenous appendix with perforation. The patient tolerated the procedure well.  On POD 1, the patient was tolerating a regular diet, voiding well, mobilizing, and pain was controlled with oral pain medications. A script for 7 days of Augmentin was sent to the pharmacy along with pain and nausea medication. The patient was stable for DC home at this time with appropriate follow up made.   Physical Exam: Gen: Awake and alert, NAD Lungs: Normal effort Abd: Soft, ND, appropriately tender around incisions. No r/r/g. Incisions with dermabond overlying, c/d/i.   Allergies as of 11/25/2018      Reactions   Codeine Nausea And Vomiting   Lidocaine Itching      Medication List    STOP taking these medications   ondansetron 4 MG tablet Commonly known as:  ZOFRAN     TAKE these medications   acetaminophen 325 MG tablet Commonly known as:  TYLENOL Take 2 tablets (650 mg total) by mouth every 6 (six) hours as needed.   amoxicillin-clavulanate 875-125 MG tablet Commonly known as:  AUGMENTIN Take 1 tablet by mouth every 12 (twelve) hours.   Biotin 5 MG Tabs Take 5 mg by mouth daily.   calcium-vitamin  D 500-200 MG-UNIT tablet Commonly known as:  OSCAL WITH D Take 1 tablet by mouth daily.   DHEA 10 MG Caps Take 10 mg by mouth daily.   docusate sodium 100 MG capsule Commonly known as:  Colace Take 1 capsule (100 mg total) by mouth daily as needed.   EC-RX Testosterone 0.2 % Crea Place 1 application onto the skin every other day.   Estrace 0.5 MG tablet Generic drug:  estradiol Take 1 mg by mouth daily.   folic acid 1 MG tablet Commonly known as:  FOLVITE Take 1 mg by mouth daily.   Green Tea Extract 90 % Liqd by Does not apply route.   Inositol 650 MG Tabs Take 1 tablet by mouth daily.   iron polysaccharides 150 MG capsule Commonly known as:  NIFEREX Take 150 mg by mouth 2 (two) times daily.   magnesium gluconate 500 MG tablet Commonly known as:  MAGONATE Take 500 mg by mouth 2 (two) times daily.   Marine Lipid Concentrate 240-360-5 MG-MG-UNIT Caps Take by mouth.   Methylcobalamin 1 MG Chew Chew 1 tablet by mouth daily.   multivitamin capsule Take 1 capsule by mouth daily.   ondansetron 4 MG disintegrating tablet Commonly known as:  ZOFRAN-ODT Take 1 tablet (4 mg total) by mouth every 6 (six) hours as needed for nausea.   polyethylene glycol packet Commonly known as:  MiraLax Take 17 g by mouth daily as needed for mild constipation.   progesterone 200 MG capsule Commonly known as:  PROMETRIUM Take 400 mg by mouth at bedtime.   Thyroid 97.5 MG  Tabs Take 97.5 mg by mouth daily. Mid-day   thyroid 65 MG tablet Commonly known as:  ARMOUR Take 130 mg by mouth every morning.   traMADol 50 MG tablet Commonly known as:  ULTRAM Take 1 tablet (50 mg total) by mouth every 6 (six) hours as needed for moderate pain or severe pain.   TRANQUIL-EZE PO Take 1 tablet by mouth daily.   vitamin C 100 MG tablet Take 100 mg by mouth daily.        Follow-up Information    [facility] Follow up on 12/14/2018.   Specialty:  General Surgery Why:   Your appointment is on 12/14/2018 at 9:00am. Due to the coronavirus this will be a televisit. Please be available by phone. Please email your name and a picture of your incisions to [email or web address] the morning of your appointment. Contact information: [address] [place] [address] [phone]           Signed: Leary Roca, [facility] 11/25/2018, 4:08 PM Pager: [phone]

## 2018-11-25 NOTE — Progress Notes (Addendum)
Received patient from PACU, AOx4, VSS, lap sites CDI, pain at 2/10, oriented to room, bed controls, call light and explained plan of care.  Patient now resting on bed trying to get some sleep.  Will monitor.

## 2018-11-25 NOTE — Discharge Instructions (Signed)
[facility]  Please arrive at least 30 min before your appointment to complete your check in paperwork.  If you are unable to arrive 30 min prior to your appointment time we may have to cancel or reschedule you. LAPAROSCOPIC SURGERY: POST OP INSTRUCTIONS Always review your discharge instruction sheet given to you by the facility where your surgery was performed. IF YOU HAVE DISABILITY OR FAMILY LEAVE FORMS, YOU MUST BRING THEM TO THE OFFICE FOR PROCESSING.   DO NOT GIVE THEM TO YOUR DOCTOR.  PAIN CONTROL  1. First take acetaminophen (Tylenol) AND/or ibuprofen (Advil) to control your pain after surgery.  Follow directions on package.  Taking acetaminophen (Tylenol) and/or ibuprofen (Advil) regularly after surgery will help to control your pain and lower the amount of prescription pain medication you may need.  You should not take more than 4,000 mg (4 grams) of acetaminophen (Tylenol) in 24 hours.  You should not take ibuprofen (Advil), aleve, motrin, naprosyn or other NSAIDS if you have a history of stomach ulcers or chronic kidney disease.  2. A prescription for pain medication may be given to you upon discharge.  Take your pain medication as prescribed, if you still have uncontrolled pain after taking acetaminophen (Tylenol) or ibuprofen (Advil). 3. Use ice packs to help control pain. 4. If you need a refill on your pain medication, please contact your pharmacy.  They will contact our office to request authorization. Prescriptions will not be filled after 5pm or on week-ends.  HOME MEDICATIONS 5. Take your usually prescribed medications unless otherwise directed.  DIET 6. You should follow a light diet the first few days after arrival home.  Be sure to include lots of fluids daily. Avoid fatty, fried foods.   CONSTIPATION 7. It is common to experience some constipation after surgery and if you are taking pain medication.  Increasing fluid intake and taking a stool  softener (such as Colace) will usually help or prevent this problem from occurring.  A mild laxative (Milk of Magnesia or Miralax) should be taken according to package instructions if there are no bowel movements after 48 hours.  WOUND/INCISION CARE 8. Most patients will experience some swelling and bruising in the area of the incisions.  Ice packs will help.  Swelling and bruising can take several days to resolve.  9. Unless discharge instructions indicate otherwise, follow guidelines below  a. STERI-STRIPS - you may remove your outer bandages 48 hours after surgery, and you may shower at that time.  You have steri-strips (small skin tapes) in place directly over the incision.  These strips should be left on the skin for 7-10 days.   b. DERMABOND/SKIN GLUE - you may shower in 24 hours.  The glue will flake off over the next 2-3 weeks. 10. Any sutures or staples will be removed at the office during your follow-up visit.  ACTIVITIES 11. You may resume regular (light) daily activities beginning the next day--such as daily self-care, walking, climbing stairs--gradually increasing activities as tolerated.  You may have sexual intercourse when it is comfortable.  Refrain from any heavy lifting or straining until approved by your doctor. a. You may drive when you are no longer taking prescription pain medication, you can comfortably wear a seatbelt, and you can safely maneuver your car and apply brakes.  FOLLOW-UP 12. You should see your doctor in the office for a follow-up appointment approximately 2-3 weeks after your surgery.  You should have been given your post-op/follow-up appointment when   your surgery was scheduled.  If you did not receive a post-op/follow-up appointment, make sure that you call for this appointment within a day or two after you arrive home to insure a convenient appointment time.   WHEN TO CALL YOUR DOCTOR: 1. Fever over 101.0 2. Inability to urinate 3. Continued bleeding from  incision. 4. Increased pain, redness, or drainage from the incision. 5. Increasing abdominal pain  The clinic staff is available to answer your questions during regular business hours.  Please don't hesitate to call and ask to speak to one of the nurses for clinical concerns.  If you have a medical emergency, go to the nearest emergency room or call 911.  A surgeon from [facility] is always on call at the hospital. [address], [address], [place], [place]  [address] ? [address] [address], [place], [place]   [address] [phone] ? [phone] ? FAX [phone]  .........   Managing Your Pain After Surgery Without Opioids    Thank you for participating in our program to help patients manage their pain after surgery without opioids. This is part of our effort to provide you with the best care possible, without exposing you or your family to the risk that opioids pose.  What pain can I expect after surgery? You can expect to have some pain after surgery. This is normal. The pain is typically worse the day after surgery, and quickly begins to get better. Many studies have found that many patients are able to manage their pain after surgery with Over-the-Counter (OTC) medications such as Tylenol and Motrin. If you have a condition that does not allow you to take Tylenol or Motrin, notify your surgical team.  How will I manage my pain? The best strategy for controlling your pain after surgery is around the clock pain control with Tylenol (acetaminophen) and Motrin (ibuprofen or Advil). Alternating these medications with each other allows you to maximize your pain control. In addition to Tylenol and Motrin, you can use heating pads or ice packs on your incisions to help reduce your pain.  How will I alternate your regular strength over-the-counter pain medication? You will take a dose of pain medication every three hours. ; Start by taking 650 mg of Tylenol (2 pills of 325  mg) ; 3 hours later take 600 mg of Motrin (3 pills of 200 mg) ; 3 hours after taking the Motrin take 650 mg of Tylenol ; 3 hours after that take 600 mg of Motrin.   - 1 -  See example - if your first dose of Tylenol is at 12:00 PM   12:00 PM Tylenol 650 mg (2 pills of 325 mg)  3:00 PM Motrin 600 mg (3 pills of 200 mg)  6:00 PM Tylenol 650 mg (2 pills of 325 mg)  9:00 PM Motrin 600 mg (3 pills of 200 mg)  Continue alternating every 3 hours   We recommend that you follow this schedule around-the-clock for at least 3 days after surgery, or until you feel that it is no longer needed. Use the table on the last page of this handout to keep track of the medications you are taking. Important: Do not take more than 3000mg of Tylenol or 3200mg of Motrin in a 24-hour period. Do not take ibuprofen/Motrin if you have a history of bleeding stomach ulcers, severe kidney disease, &/or actively taking a blood thinner  What if I still have pain? If you have pain that is not   controlled with the over-the-counter pain medications (Tylenol and Motrin or Advil) you might have what we call "breakthrough" pain. You will receive a prescription for a small amount of an opioid pain medication such as Oxycodone, Tramadol, or Tylenol with Codeine. Use these opioid pills in the first 24 hours after surgery if you have breakthrough pain. Do not take more than 1 pill every 4-6 hours.  If you still have uncontrolled pain after using all opioid pills, don't hesitate to call our staff using the number provided. We will help make sure you are managing your pain in the best way possible, and if necessary, we can provide a prescription for additional pain medication.   Day 1    Time  Name of Medication Number of pills taken  Amount of Acetaminophen  Pain Level   Comments  AM PM       AM PM       AM PM       AM PM       AM PM       AM PM       AM PM       AM PM       Total Daily amount of Acetaminophen Do not  take more than  3,000 mg per day      Day 2    Time  Name of Medication Number of pills taken  Amount of Acetaminophen  Pain Level   Comments  AM PM       AM PM       AM PM       AM PM       AM PM       AM PM       AM PM       AM PM       Total Daily amount of Acetaminophen Do not take more than  3,000 mg per day      Day 3    Time  Name of Medication Number of pills taken  Amount of Acetaminophen  Pain Level   Comments  AM PM       AM PM       AM PM       AM PM          AM PM       AM PM       AM PM       AM PM       Total Daily amount of Acetaminophen Do not take more than  3,000 mg per day      Day 4    Time  Name of Medication Number of pills taken  Amount of Acetaminophen  Pain Level   Comments  AM PM       AM PM       AM PM       AM PM       AM PM       AM PM       AM PM       AM PM       Total Daily amount of Acetaminophen Do not take more than  3,000 mg per day      Day 5    Time  Name of Medication Number of pills taken  Amount of Acetaminophen  Pain Level   Comments  AM PM       AM PM       AM   PM       AM PM       AM PM       AM PM       AM PM       AM PM       Total Daily amount of Acetaminophen Do not take more than  3,000 mg per day       Day 6    Time  Name of Medication Number of pills taken  Amount of Acetaminophen  Pain Level  Comments  AM PM       AM PM       AM PM       AM PM       AM PM       AM PM       AM PM       AM PM       Total Daily amount of Acetaminophen Do not take more than  3,000 mg per day      Day 7    Time  Name of Medication Number of pills taken  Amount of Acetaminophen  Pain Level   Comments  AM PM       AM PM       AM PM       AM PM       AM PM       AM PM       AM PM       AM PM       Total Daily amount of Acetaminophen Do not take more than  3,000 mg per day        For additional information about how and where to safely dispose of unused  opioid medications - https://www.[employer]powerful[employer].org  Disclaimer: This document contains information and/or instructional materials adapted from [facility] for the typical patient with your condition. It does not replace medical advice from your health care provider because your experience may differ from that of the typical patient. Talk to your health care provider if you have any questions about this document, your condition or your treatment plan. Adapted from [facility]   

## 2019-01-28 ENCOUNTER — Emergency Department (HOSPITAL_COMMUNITY)
Admission: EM | Admit: 2019-01-28 | Discharge: 2019-01-28 | Disposition: A | Discharge status: Home / Self Care | Payer: BLUE CROSS/BLUE SHIELD | Attending: Emergency Medicine | Admitting: Emergency Medicine

## 2019-01-28 ENCOUNTER — Emergency Department (HOSPITAL_COMMUNITY): Payer: BLUE CROSS/BLUE SHIELD

## 2019-01-28 ENCOUNTER — Other Ambulatory Visit: Payer: Self-pay

## 2019-01-28 ENCOUNTER — Encounter (HOSPITAL_COMMUNITY): Payer: Self-pay | Admitting: Emergency Medicine

## 2019-01-28 DIAGNOSIS — K2970 Gastritis, unspecified, without bleeding: Secondary | ICD-10-CM | POA: Diagnosis not present

## 2019-01-28 DIAGNOSIS — R1013 Epigastric pain: Secondary | ICD-10-CM | POA: Diagnosis present

## 2019-01-28 DIAGNOSIS — Z79899 Other long term (current) drug therapy: Secondary | ICD-10-CM | POA: Diagnosis not present

## 2019-01-28 LAB — COMPREHENSIVE METABOLIC PANEL
ALT: 19 U/L (ref 0–44)
AST: 18 U/L (ref 15–41)
Albumin: 4.3 g/dL (ref 3.5–5.0)
Alkaline Phosphatase: 88 U/L (ref 38–126)
Anion gap: 16 — ABNORMAL HIGH (ref 5–15)
BUN: 9 mg/dL (ref 6–20)
CO2: 23 mmol/L (ref 22–32)
Calcium: 9.9 mg/dL (ref 8.9–10.3)
Chloride: 101 mmol/L (ref 98–111)
Creatinine, Ser: 0.72 mg/dL (ref 0.44–1.00)
GFR calc Af Amer: >60 mL/min (ref >60)
GFR calc non Af Amer: >60 mL/min (ref >60)
Glucose, Bld: 89 mg/dL (ref 70–99)
Potassium: 3.7 mmol/L (ref 3.5–5.1)
Sodium: 140 mmol/L (ref 135–145)
Total Bilirubin: 1.1 mg/dL (ref 0.3–1.2)
Total Protein: 7.8 g/dL (ref 6.5–8.1)

## 2019-01-28 LAB — URINALYSIS, ROUTINE W REFLEX MICROSCOPIC
Bilirubin Urine: NEGATIVE
Glucose, UA: NEGATIVE mg/dL
Hgb urine dipstick: NEGATIVE
Ketones, ur: 80 mg/dL — AB
Leukocytes,Ua: NEGATIVE
Nitrite: NEGATIVE
Protein, ur: NEGATIVE mg/dL
Specific Gravity, Urine: 1.025 (ref 1.005–1.030)
pH: 5 (ref 5.0–8.0)

## 2019-01-28 LAB — CBC WITH DIFFERENTIAL/PLATELET
Abs Immature Granulocytes: 0.06 10*3/uL (ref 0.00–0.07)
Basophils Absolute: 0.1 10*3/uL (ref 0.0–0.1)
Basophils Relative: 0 %
Eosinophils Absolute: 0.1 10*3/uL (ref 0.0–0.5)
Eosinophils Relative: 1 %
HCT: 48.3 % — ABNORMAL HIGH (ref 36.0–46.0)
Hemoglobin: 15.8 g/dL — ABNORMAL HIGH (ref 12.0–15.0)
Immature Granulocytes: 1 %
Lymphocytes Relative: 23 %
Lymphs Abs: 2.9 10*3/uL (ref 0.7–4.0)
MCH: 30 pg (ref 26.0–34.0)
MCHC: 32.7 g/dL (ref 30.0–36.0)
MCV: 91.8 fL (ref 80.0–100.0)
Monocytes Absolute: 0.6 10*3/uL (ref 0.1–1.0)
Monocytes Relative: 5 %
Neutro Abs: 8.7 10*3/uL — ABNORMAL HIGH (ref 1.7–7.7)
Neutrophils Relative %: 70 %
Platelets: 262 10*3/uL (ref 150–400)
RBC: 5.26 MIL/uL — ABNORMAL HIGH (ref 3.87–5.11)
RDW: 12.4 % (ref 11.5–15.5)
WBC: 12.4 10*3/uL — ABNORMAL HIGH (ref 4.0–10.5)
nRBC: 0 % (ref 0.0–0.2)

## 2019-01-28 LAB — LIPASE, BLOOD: Lipase: 30 U/L (ref 11–51)

## 2019-01-28 MED ORDER — FAMOTIDINE IN NACL 20-0.9 MG/50ML-% IV SOLN
20.0000 mg | Freq: Once | INTRAVENOUS | Status: AC
  Administered 2019-01-28: 20 mg via INTRAVENOUS
  Filled 2019-01-28: qty 50

## 2019-01-28 MED ORDER — ALUM & MAG HYDROXIDE-SIMETH 200-200-20 MG/5ML PO SUSP
30.0000 mL | Freq: Once | ORAL | Status: AC
  Administered 2019-01-28: 22:00:00 30 mL via ORAL
  Filled 2019-01-28: qty 30

## 2019-01-28 MED ORDER — SUCRALFATE 1 G PO TABS: 1.0000 g | ORAL_TABLET | Freq: Three times a day (TID) | ORAL | 0 refills | Status: DC

## 2019-01-28 MED ORDER — ESOMEPRAZOLE MAGNESIUM 40 MG PO CPDR: 40.0000 mg | DELAYED_RELEASE_CAPSULE | Freq: Every day | ORAL | 0 refills | Status: AC

## 2019-01-28 MED ORDER — ONDANSETRON HCL 4 MG/2ML IJ SOLN
4.0000 mg | Freq: Once | INTRAMUSCULAR | Status: AC
  Administered 2019-01-28: 4 mg via INTRAVENOUS
  Filled 2019-01-28: qty 2

## 2019-01-28 MED ORDER — ESOMEPRAZOLE MAGNESIUM 40 MG PO CPDR: 40.0000 mg | DELAYED_RELEASE_CAPSULE | Freq: Every day | ORAL | 0 refills | Status: DC

## 2019-01-28 MED ORDER — FAMOTIDINE 20 MG PO TABS: 20.0000 mg | ORAL_TABLET | Freq: Two times a day (BID) | ORAL | 0 refills | Status: DC | PRN

## 2019-01-28 MED ORDER — SUCRALFATE 1 G PO TABS: 1.0000 g | ORAL_TABLET | Freq: Three times a day (TID) | ORAL | 0 refills | Status: AC

## 2019-01-28 MED ORDER — FAMOTIDINE 20 MG PO TABS: 20.0000 mg | ORAL_TABLET | Freq: Two times a day (BID) | ORAL | 0 refills | Status: AC | PRN

## 2019-01-28 MED ORDER — SODIUM CHLORIDE 0.9 % IV BOLUS
1000.0000 mL | Freq: Once | INTRAVENOUS | Status: AC
  Administered 2019-01-28: 1000 mL via INTRAVENOUS

## 2019-01-28 NOTE — Discharge Instructions (Signed)
Take pepcid as needed  Take carafate four times daily   Take nexium daily   Avoid spicy food   See your doctor. Consider following up with GI doctor for endoscopy   Return to ER if you have worse abdominal pain, vomiting.

## 2019-01-28 NOTE — ED Triage Notes (Signed)
Pt. Stated, I started having stomach cramping on Tuesday , went away  And then came back agin around 0400 this morning. I did have some diarrhea and nausea today. I had 5 diarrhea, and I threw up 3 times and they were samll

## 2019-01-28 NOTE — ED Provider Notes (Signed)
[facility] EMERGENCY DEPARTMENT Provider Note   CSN: [id number] Arrival date & time: 01/28/19  1621    History   Chief Complaint Chief Complaint  Patient presents with   Abdominal Cramping    HPI Nancy Castaneda is a 53 y.o. female hx of Lyme disease, recent ruptured appendix finished a course of antibiotics here presenting with epigastric pain.  Patient states that she has been eating some spicy burgers.  For the last 3 to 4 days, she has been having some epigastric pain and some abdominal cramping.  She also has some nausea as well.  Patient denies any fevers or chills. Has some loose stools as well. Denies any recent travel or sick contacts     The history is provided by the patient.    Past Medical History:  Diagnosis Date   Complication of anesthesia    Epstein Barr infection    Lyme disease    Lyme disease    PONV (postoperative nausea and vomiting)    Thyroid disease     Patient Active Problem List   Diagnosis Date Noted   Acute appendicitis 11/24/2018    Past Surgical History:  Procedure Laterality Date   APPENDECTOMY     CESAREAN SECTION     LAPAROSCOPIC APPENDECTOMY N/A 11/24/2018   Procedure: APPENDECTOMY LAPAROSCOPIC;  Surgeon: Berna Bue, MD;  Location: [facility] OR;  Service: General;  Laterality: N/A;   LIPOSUCTION       OB History   No obstetric history on file.      Home Medications    Prior to Admission medications   Medication Sig Start Date End Date Taking? Authorizing Provider  acetaminophen (TYLENOL) 325 MG tablet Take 2 tablets (650 mg total) by mouth every 6 (six) hours as needed. 11/25/18   Maczis, Elmer Sow, PA-C  amoxicillin-clavulanate (AUGMENTIN) 875-125 MG tablet Take 1 tablet by mouth every 12 (twelve) hours. 11/25/18   Maczis, Elmer Sow, PA-C  Ascorbic Acid (VITAMIN C) 100 MG tablet Take 100 mg by mouth daily.    [provider]  Biotin 5 MG TABS Take 5 mg by mouth daily.     [provider]  calcium-vitamin D (OSCAL WITH D) 500-200 MG-UNIT per tablet Take 1 tablet by mouth daily.    [provider]  DHEA 10 MG CAPS Take 10 mg by mouth daily.     [provider]  docusate sodium (COLACE) 100 MG capsule Take 1 capsule (100 mg total) by mouth daily as needed. 11/25/18 11/25/19  Maczis, Elmer Sow, PA-C  EC-RX TESTOSTERONE 0.2 % CREA Place 1 application onto the skin every other day.    [provider]  ESTRACE 0.5 MG tablet Take 1 mg by mouth daily.  10/04/18   [provider]  folic acid (FOLVITE) 1 MG tablet Take 1 mg by mouth daily.    [provider]  Janett Billow, Camillia sinensis, (GREEN TEA EXTRACT) 90 % LIQD by Does not apply route.    [provider]  Inositol 650 MG TABS Take 1 tablet by mouth daily.     [provider]  iron polysaccharides (NIFEREX) 150 MG capsule Take 150 mg by mouth 2 (two) times daily.    [provider]  magnesium gluconate (MAGONATE) 500 MG tablet Take 500 mg by mouth 2 (two) times daily.    [provider]  Methylcobalamin 1 MG CHEW Chew 1 tablet by mouth daily.     [provider]  Multiple Vitamin (MULTIVITAMIN) capsule Take 1 capsule by mouth daily.    [provider]  Nutritional Supplements (TRANQUIL-EZE PO) Take 1 tablet by mouth daily.     [provider]  Omega-3 Fatty Acids (MARINE LIPID CONCENTRATE) 240-360-5 MG-MG-UNIT CAPS Take by mouth.    [provider]  ondansetron (ZOFRAN-ODT) 4 MG disintegrating tablet Take 1 tablet (4 mg total) by mouth every 6 (six) hours as needed for nausea. 11/25/18   Maczis, Elmer Sow, PA-C  polyethylene glycol [facility]) packet Take 17 g by mouth daily as needed for mild constipation. 11/25/18   Maczis, Elmer Sow, PA-C  progesterone (PROMETRIUM) 200 MG capsule Take 400 mg by mouth at bedtime. 08/19/18   [provider]  thyroid (ARMOUR) 65 MG tablet Take 130 mg by mouth every  morning.     [provider]  Thyroid 97.5 MG TABS Take 97.5 mg by mouth daily. Mid-day    [provider]  traMADol (ULTRAM) 50 MG tablet Take 1 tablet (50 mg total) by mouth every 6 (six) hours as needed for moderate pain or severe pain. 11/25/18   Maczis, Elmer Sow, PA-C    Family History No family history on file.  Social History Social History   Tobacco Use   Smoking status: Never Smoker   Smokeless tobacco: Never Used  Substance Use Topics   Alcohol use: No   Drug use: No     Allergies   Codeine and Lidocaine   Review of Systems Review of Systems  Gastrointestinal: Positive for abdominal pain.  All other systems reviewed and are negative.    Physical Exam Updated Vital Signs BP 139/64 (BP Location: Left Arm)    Pulse 69    Temp 98.4 F (36.9 C) (Oral)    Resp 16    LMP 12/07/2011    SpO2 99%   Physical Exam Vitals signs and nursing note reviewed.  HENT:     Head: Normocephalic.     Mouth/Throat:     Mouth: Mucous membranes are dry.  Eyes:     Extraocular Movements: Extraocular movements intact.     Pupils: Pupils are equal, round, and reactive to light.  Neck:     Musculoskeletal: Normal range of motion.  Cardiovascular:     Rate and Rhythm: Normal rate and regular rhythm.  Pulmonary:     Effort: Pulmonary effort is normal.     Breath sounds: Normal breath sounds.  Abdominal:     General: Abdomen is flat.     Comments: +epigastric tenderness   Musculoskeletal: Normal range of motion.  Skin:    General: Skin is warm.     Capillary Refill: Capillary refill takes less than 2 seconds.  Neurological:     General: No focal deficit present.     Mental Status: She is alert.  Psychiatric:        Mood and Affect: Mood normal.        Behavior: Behavior normal.      ED Treatments / Results  Labs (all labs ordered are listed, but only abnormal results are displayed) Labs Reviewed  CBC WITH DIFFERENTIAL/PLATELET - Abnormal; Notable  for the following components:      Result Value   WBC 12.4 (*)    RBC 5.26 (*)    Hemoglobin 15.8 (*)    HCT 48.3 (*)    Neutro Abs 8.7 (*)    All other components within normal limits  COMPREHENSIVE METABOLIC PANEL - Abnormal; Notable for the following  components:   Anion gap 16 (*)    All other components within normal limits  URINALYSIS, ROUTINE W REFLEX MICROSCOPIC - Abnormal; Notable for the following components:   APPearance HAZY (*)    Ketones, ur 80 (*)    All other components within normal limits  LIPASE, BLOOD    EKG None  Radiology Ct Renal Stone Study  Result Date: 01/28/2019 CLINICAL DATA:  Epigastric and flank pain. Diarrhea and nausea. Vomiting. EXAM: CT ABDOMEN AND PELVIS WITHOUT CONTRAST TECHNIQUE: Multidetector CT imaging of the abdomen and pelvis was performed following the standard protocol without IV contrast. COMPARISON:  11/24/2018 CT abdomen/pelvis. FINDINGS: Lower chest: No significant pulmonary nodules or acute consolidative airspace disease. Hepatobiliary: Normal liver size. No liver mass. Normal gallbladder with no radiopaque cholelithiasis. No biliary ductal dilatation. Pancreas: Normal, with no mass or duct dilation. Spleen: Normal size. No mass. Adrenals/Urinary Tract: Normal adrenals. No renal stones. No hydronephrosis. No contour deforming renal masses. Normal caliber ureters. No ureteral stones. Normal nondistended bladder. Stomach/Bowel: Apparent wall thickening throughout the body of the stomach. Stomach is nondistended. Normal caliber small bowel with no small bowel wall thickening. Appendectomy. Normal large bowel with no diverticulosis, large bowel wall thickening or pericolonic fat stranding. Vascular/Lymphatic: Atherosclerotic nonaneurysmal abdominal aorta. No pathologically enlarged lymph nodes in the abdomen or pelvis. Reproductive: Grossly normal uterus.  No adnexal mass. Other: No pneumoperitoneum, ascites or focal fluid collection.  Musculoskeletal: No aggressive appearing focal osseous lesions. Moderate thoracolumbar spondylosis, most prominent at L5-S1. IMPRESSION: 1. Apparent wall thickening throughout the body of the nondistended stomach, nonspecific, potentially indicative of gastritis, with other etiologies including ulcer disease or neoplasm not excluded by CT. Any need for further evaluation such as GI consultation should be based on clinical assessment. 2. No bowel obstruction. 3. No urolithiasis.  No hydronephrosis. Electronically Signed   By: Delbert PhenixJason A Poff M.D.   On: 01/28/2019 19:38    Procedures Procedures (including critical care time)  Medications Ordered in ED Medications  sodium chloride 0.9 % bolus 1,000 mL (0 mLs Intravenous Stopped 01/28/19 1955)  ondansetron (ZOFRAN) injection 4 mg (4 mg Intravenous Given 01/28/19 1906)  famotidine (PEPCID) IVPB 20 mg premix (20 mg Intravenous New Bag/Given 01/28/19 2202)  alum & mag hydroxide-simeth (MAALOX/MYLANTA) 200-200-20 MG/5ML suspension 30 mL (30 mLs Oral Given 01/28/19 2203)     Initial Impression / Assessment and Plan / ED Course  I have reviewed the triage vital signs and the nursing notes.  Pertinent labs & imaging results that were available during my care of the patient were reviewed by me and considered in my medical decision making (see chart for details).       Nancy Castaneda is a 53 y.o. female here with epigastric pain. Had recent ruptured appendix. Consider gastritis vs colitis vs pancreatitis. Will get labs, CT ab/pel, lipase.   10:54 PM  Patient's CT showed inflammation around the gastric body consider some peptic ulcer disease or gastritis.  Given some Pepcid and GI cocktail and felt better.  Recommend Pepcid, Nexium, Carafate.  Will refer to GI for endoscopy.   Final Clinical Impressions(s) / ED Diagnoses   Final diagnoses:  None    ED Discharge Orders    None       Charlynne PanderYao, Victorya Hillman Hsienta, MD 01/28/19 2256

## 2019-10-14 IMAGING — CT CT RENAL STONE PROTOCOL
2 of 4 series · 17 of 46 positions shown, 19 images · non-contrast
Comparison: 11/24/2018 CT abdomen/pelvis.

CLINICAL DATA: Epigastric and flank pain. Diarrhea and nausea.
Vomiting.

EXAM:
CT ABDOMEN AND PELVIS WITHOUT CONTRAST
TECHNIQUE: Multidetector CT imaging of the abdomen and pelvis was performed
following the standard protocol without IV contrast.

[Series 3: ap without · axial · non-contrast · 0.79mm/px · z∈[+927,+1332]mm · 14 of 93 slices shown, 16 images]
[im 6/93  soft-tissue]
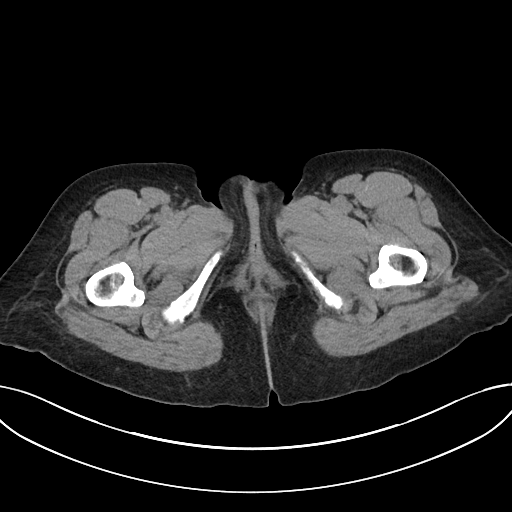
[im 6/93  bone]
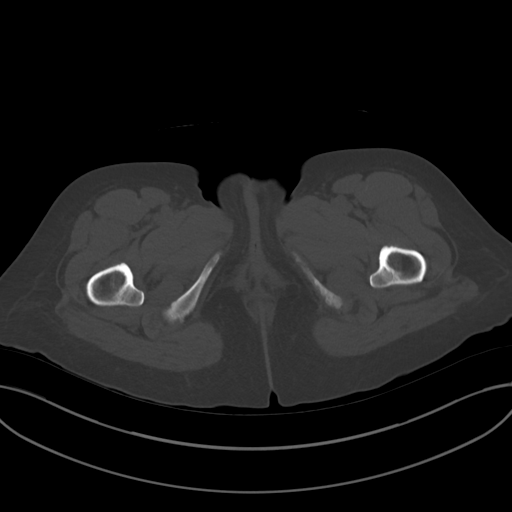
[im 11/93  soft-tissue]
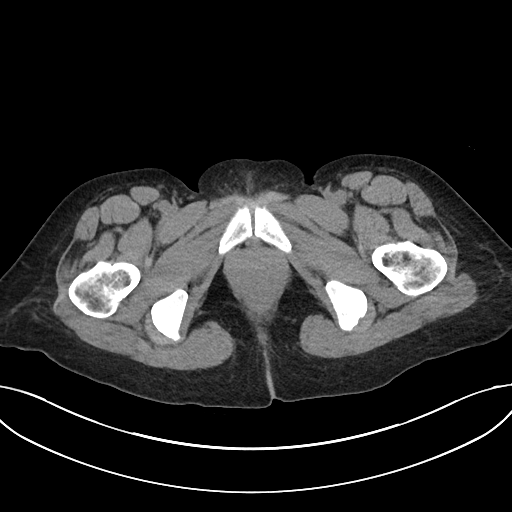
[im 17/93  soft-tissue]
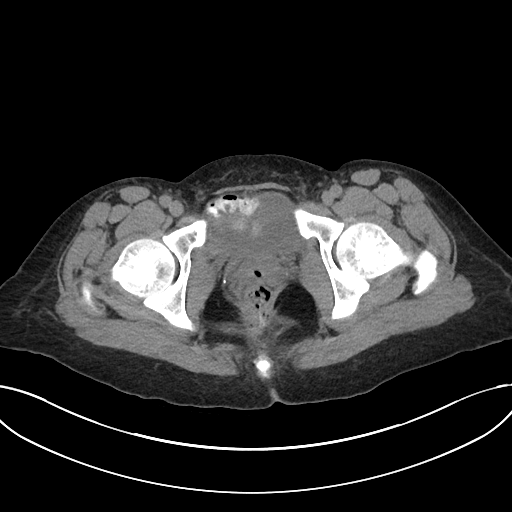
[im 28/93  soft-tissue]
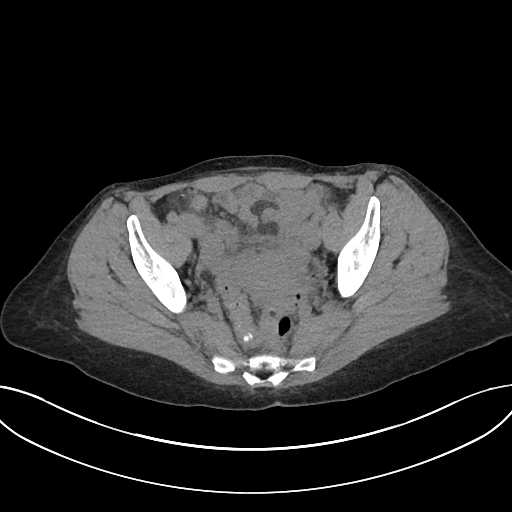
[im 33/93  soft-tissue]
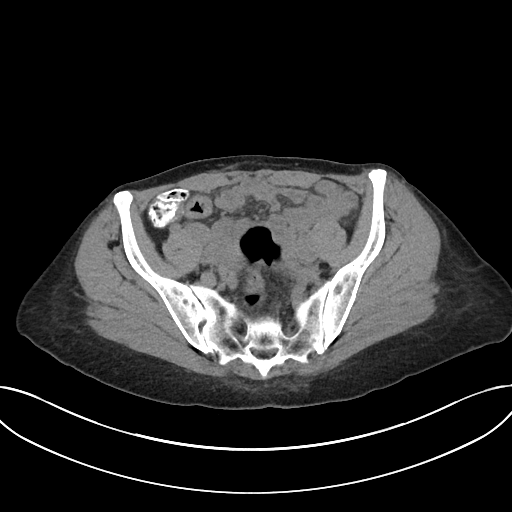
[im 38/93  soft-tissue]
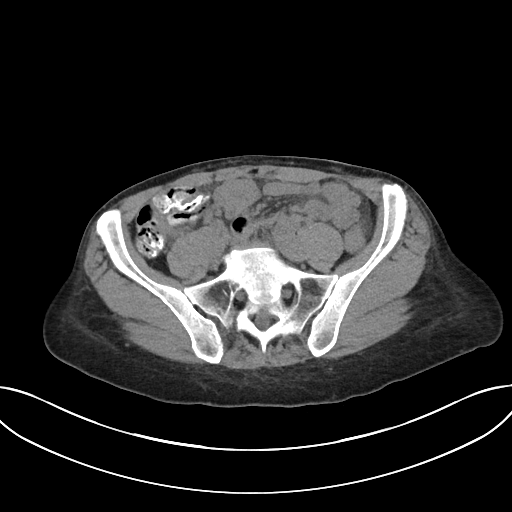
[im 44/93  soft-tissue]
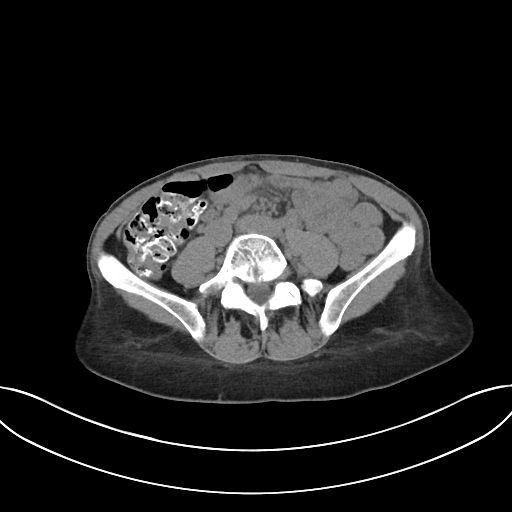
[im 49/93  soft-tissue]
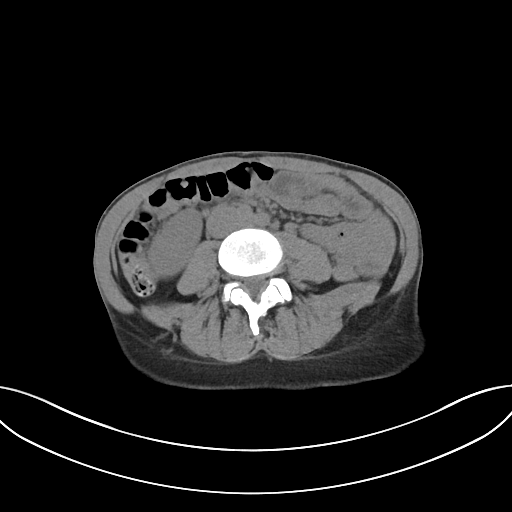
[im 55/93  soft-tissue]
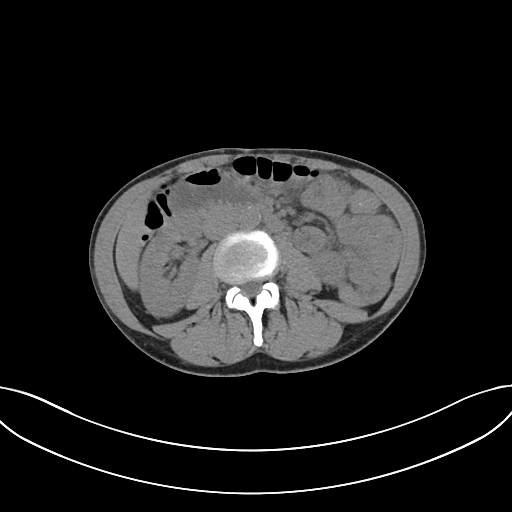
[im 55/93  bone]
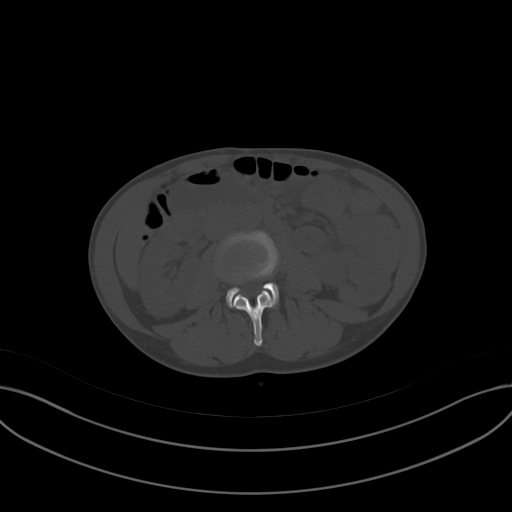
[im 60/93  soft-tissue]
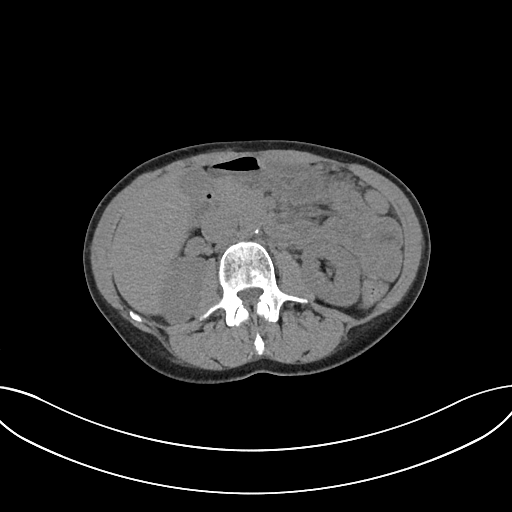
[im 71/93  soft-tissue]
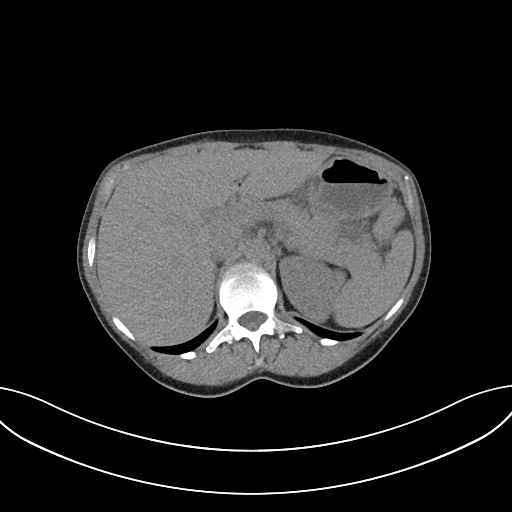
[im 76/93  soft-tissue]
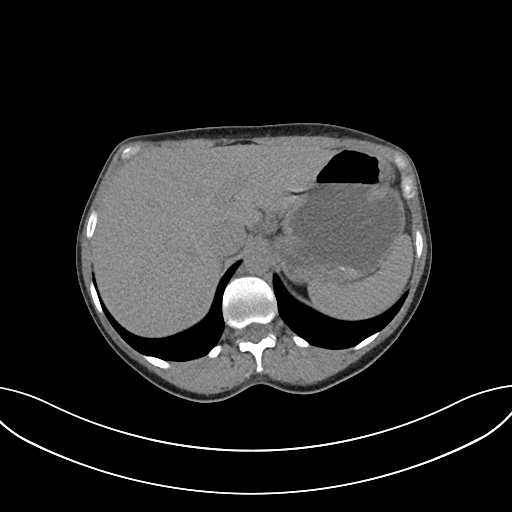
[im 82/93  soft-tissue]
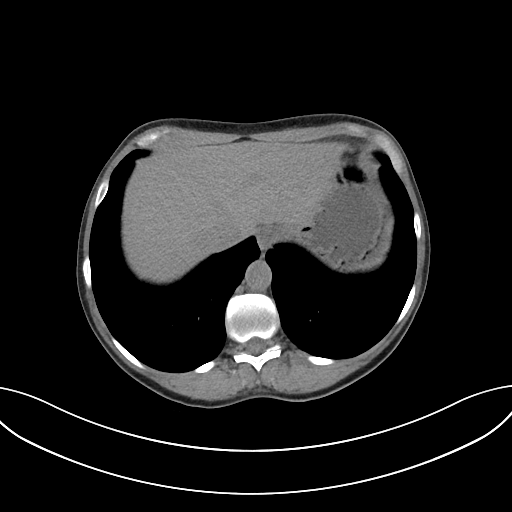
[im 87/93  soft-tissue]
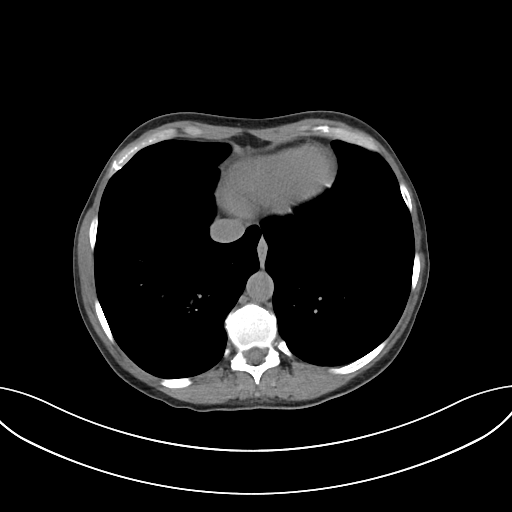

[Series 6: cor · coronal · 0.88mm/px · 3 of 88 slices shown]
[im 30/88  soft-tissue]
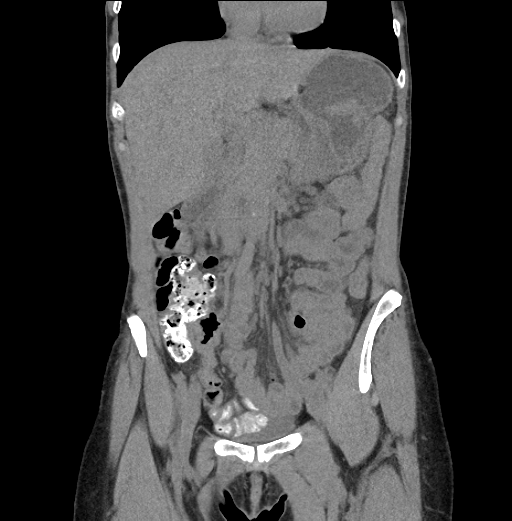
[im 39/88  soft-tissue]
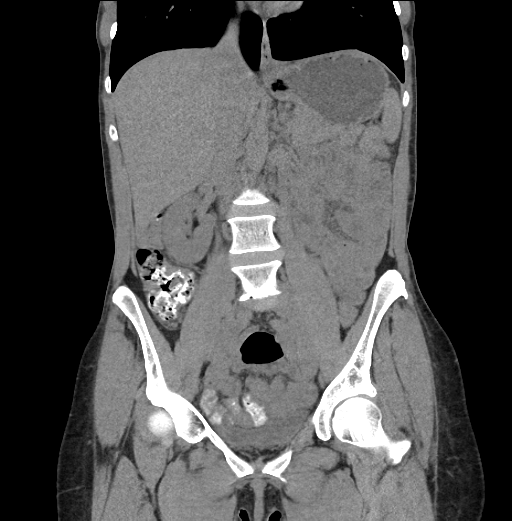
[im 49/88  soft-tissue]
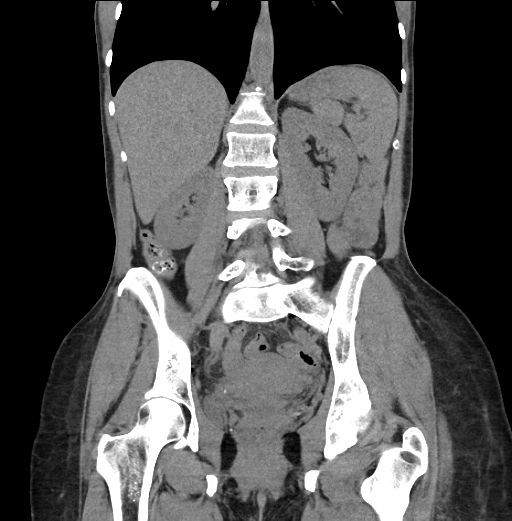

[17 of 46 positions shown; findings below may reference images not displayed]

FINDINGS: Lower chest: No significant pulmonary nodules or acute consolidative
airspace disease.

Hepatobiliary: Normal liver size. No liver mass. Normal gallbladder
with no radiopaque cholelithiasis. No biliary ductal dilatation.

Pancreas: Normal, with no mass or duct dilation.

Spleen: Normal size. No mass.

Adrenals/Urinary Tract: Normal adrenals. No renal stones. No
hydronephrosis. No contour deforming renal masses. Normal caliber
ureters. No ureteral stones. Normal nondistended bladder.

Stomach/Bowel: Apparent wall thickening throughout the body of the
stomach. Stomach is nondistended. Normal caliber small bowel with no
small bowel wall thickening. Appendectomy. Normal large bowel with
no diverticulosis, large bowel wall thickening or pericolonic fat
stranding.

Vascular/Lymphatic: Atherosclerotic nonaneurysmal abdominal aorta.
No pathologically enlarged lymph nodes in the abdomen or pelvis.

Reproductive: Grossly normal uterus.  No adnexal mass.

Other: No pneumoperitoneum, ascites or focal fluid collection.

Musculoskeletal: No aggressive appearing focal osseous lesions.
Moderate thoracolumbar spondylosis, most prominent at L5-S1.
IMPRESSION: 1. Apparent wall thickening throughout the body of the nondistended
stomach, nonspecific, potentially indicative of gastritis, with
other etiologies including ulcer disease or neoplasm not excluded by
CT. Any need for further evaluation such as GI consultation should
be based on clinical assessment.
2. No bowel obstruction.
3. No urolithiasis.  No hydronephrosis.
